# Patient Record
Sex: Female | Born: 1970 | Race: Black or African American | Hispanic: No | Marital: Single | Smoking: Never smoker
Health system: Southern US, Community
[De-identification: ages and names within clinical notes are randomized; demographics above are authoritative.]

## PROBLEM LIST (undated history)

## (undated) DIAGNOSIS — F32A Depression, unspecified: Secondary | ICD-10-CM

## (undated) DIAGNOSIS — Z9851 Tubal ligation status: Secondary | ICD-10-CM

## (undated) DIAGNOSIS — Z9049 Acquired absence of other specified parts of digestive tract: Secondary | ICD-10-CM

## (undated) DIAGNOSIS — F988 Other specified behavioral and emotional disorders with onset usually occurring in childhood and adolescence: Secondary | ICD-10-CM

## (undated) DIAGNOSIS — T7840XA Allergy, unspecified, initial encounter: Secondary | ICD-10-CM

## (undated) DIAGNOSIS — F419 Anxiety disorder, unspecified: Secondary | ICD-10-CM

## (undated) DIAGNOSIS — I10 Essential (primary) hypertension: Secondary | ICD-10-CM

## (undated) DIAGNOSIS — F329 Major depressive disorder, single episode, unspecified: Secondary | ICD-10-CM

## (undated) DIAGNOSIS — E119 Type 2 diabetes mellitus without complications: Secondary | ICD-10-CM

## (undated) DIAGNOSIS — R569 Unspecified convulsions: Secondary | ICD-10-CM

## (undated) HISTORY — PX: INDUCED ABORTION: SHX677

## (undated) HISTORY — DX: Depression, unspecified: F32.A

## (undated) HISTORY — PX: LAPAROSCOPIC CHOLECYSTECTOMY: SUR755

## (undated) HISTORY — DX: Major depressive disorder, single episode, unspecified: F32.9

## (undated) HISTORY — DX: Allergy, unspecified, initial encounter: T78.40XA

## (undated) HISTORY — DX: Anxiety disorder, unspecified: F41.9

## (undated) HISTORY — PX: TUBAL LIGATION: SHX77

## (undated) HISTORY — DX: Type 2 diabetes mellitus without complications: E11.9

## (undated) HISTORY — DX: Unspecified convulsions: R56.9

## (undated) HISTORY — DX: Other specified behavioral and emotional disorders with onset usually occurring in childhood and adolescence: F98.8

---

## 1989-10-10 DIAGNOSIS — Z9049 Acquired absence of other specified parts of digestive tract: Secondary | ICD-10-CM

## 1989-10-10 HISTORY — DX: Acquired absence of other specified parts of digestive tract: Z90.49

## 1998-05-18 ENCOUNTER — Emergency Department (HOSPITAL_COMMUNITY): Admission: EM | Admit: 1998-05-18 | Discharge: 1998-05-18 | Payer: Self-pay | Admitting: Internal Medicine

## 1999-07-16 ENCOUNTER — Emergency Department (HOSPITAL_COMMUNITY): Admission: EM | Admit: 1999-07-16 | Discharge: 1999-07-16 | Payer: Self-pay | Admitting: Emergency Medicine

## 1999-08-27 ENCOUNTER — Other Ambulatory Visit: Admission: RE | Admit: 1999-08-27 | Discharge: 1999-08-27 | Payer: Self-pay | Admitting: Obstetrics & Gynecology

## 1999-12-28 ENCOUNTER — Other Ambulatory Visit: Admission: RE | Admit: 1999-12-28 | Discharge: 1999-12-28 | Payer: Self-pay | Admitting: Obstetrics and Gynecology

## 2000-06-22 ENCOUNTER — Inpatient Hospital Stay (HOSPITAL_COMMUNITY): Admission: AD | Admit: 2000-06-22 | Discharge: 2000-06-22 | Payer: Self-pay | Admitting: Obstetrics and Gynecology

## 2000-08-01 ENCOUNTER — Inpatient Hospital Stay (HOSPITAL_COMMUNITY): Admission: AD | Admit: 2000-08-01 | Discharge: 2000-08-05 | Payer: Self-pay | Admitting: *Deleted

## 2000-08-06 ENCOUNTER — Encounter: Admission: RE | Admit: 2000-08-06 | Discharge: 2000-09-29 | Payer: Self-pay | Admitting: Unknown Physician Specialty

## 2000-08-09 ENCOUNTER — Inpatient Hospital Stay (HOSPITAL_COMMUNITY): Admission: AD | Admit: 2000-08-09 | Discharge: 2000-08-09 | Payer: Self-pay | Admitting: Obstetrics & Gynecology

## 2001-04-03 ENCOUNTER — Encounter: Payer: Self-pay | Admitting: Emergency Medicine

## 2001-04-03 ENCOUNTER — Emergency Department (HOSPITAL_COMMUNITY): Admission: EM | Admit: 2001-04-03 | Discharge: 2001-04-03 | Payer: Self-pay | Admitting: Emergency Medicine

## 2001-07-23 ENCOUNTER — Encounter: Admission: RE | Admit: 2001-07-23 | Discharge: 2001-10-21 | Payer: Self-pay | Admitting: Internal Medicine

## 2001-11-28 ENCOUNTER — Other Ambulatory Visit: Admission: RE | Admit: 2001-11-28 | Discharge: 2001-11-28 | Payer: Self-pay | Admitting: Obstetrics and Gynecology

## 2001-12-14 ENCOUNTER — Emergency Department (HOSPITAL_COMMUNITY): Admission: EM | Admit: 2001-12-14 | Discharge: 2001-12-14 | Payer: Self-pay | Admitting: Emergency Medicine

## 2002-01-04 ENCOUNTER — Encounter: Admission: RE | Admit: 2002-01-04 | Discharge: 2002-04-04 | Payer: Self-pay | Admitting: Otolaryngology

## 2002-01-06 ENCOUNTER — Emergency Department (HOSPITAL_COMMUNITY): Admission: EM | Admit: 2002-01-06 | Discharge: 2002-01-06 | Payer: Self-pay

## 2002-07-15 ENCOUNTER — Encounter: Payer: Self-pay | Admitting: Neurology

## 2002-07-15 ENCOUNTER — Emergency Department (HOSPITAL_COMMUNITY): Admission: EM | Admit: 2002-07-15 | Discharge: 2002-07-15 | Payer: Self-pay | Admitting: Emergency Medicine

## 2002-10-21 ENCOUNTER — Emergency Department (HOSPITAL_COMMUNITY): Admission: EM | Admit: 2002-10-21 | Discharge: 2002-10-21 | Payer: Self-pay | Admitting: Emergency Medicine

## 2002-10-21 ENCOUNTER — Encounter: Payer: Self-pay | Admitting: Emergency Medicine

## 2002-11-07 ENCOUNTER — Other Ambulatory Visit: Admission: RE | Admit: 2002-11-07 | Discharge: 2002-11-07 | Payer: Self-pay | Admitting: Obstetrics and Gynecology

## 2003-01-18 ENCOUNTER — Inpatient Hospital Stay (HOSPITAL_COMMUNITY): Admission: AD | Admit: 2003-01-18 | Discharge: 2003-01-18 | Payer: Self-pay | Admitting: Obstetrics and Gynecology

## 2003-01-21 ENCOUNTER — Encounter: Admission: RE | Admit: 2003-01-21 | Discharge: 2003-04-21 | Payer: Self-pay | Admitting: Internal Medicine

## 2003-04-07 ENCOUNTER — Inpatient Hospital Stay (HOSPITAL_COMMUNITY): Admission: AD | Admit: 2003-04-07 | Discharge: 2003-04-07 | Payer: Self-pay | Admitting: Obstetrics and Gynecology

## 2003-05-15 ENCOUNTER — Inpatient Hospital Stay (HOSPITAL_COMMUNITY): Admission: AD | Admit: 2003-05-15 | Discharge: 2003-05-18 | Payer: Self-pay | Admitting: Obstetrics and Gynecology

## 2003-05-15 ENCOUNTER — Encounter (INDEPENDENT_AMBULATORY_CARE_PROVIDER_SITE_OTHER): Payer: Self-pay

## 2003-05-15 DIAGNOSIS — Z9851 Tubal ligation status: Secondary | ICD-10-CM

## 2003-05-15 HISTORY — DX: Tubal ligation status: Z98.51

## 2003-05-20 ENCOUNTER — Inpatient Hospital Stay (HOSPITAL_COMMUNITY): Admission: AD | Admit: 2003-05-20 | Discharge: 2003-05-20 | Payer: Self-pay | Admitting: Obstetrics and Gynecology

## 2004-01-27 ENCOUNTER — Encounter: Admission: RE | Admit: 2004-01-27 | Discharge: 2004-01-27 | Payer: Self-pay | Admitting: Family Medicine

## 2004-01-28 ENCOUNTER — Encounter: Admission: RE | Admit: 2004-01-28 | Discharge: 2004-01-28 | Payer: Self-pay | Admitting: Family Medicine

## 2004-01-30 ENCOUNTER — Encounter: Admission: RE | Admit: 2004-01-30 | Discharge: 2004-01-30 | Payer: Self-pay | Admitting: Family Medicine

## 2004-02-27 ENCOUNTER — Encounter: Admission: RE | Admit: 2004-02-27 | Discharge: 2004-02-27 | Payer: Self-pay | Admitting: Sports Medicine

## 2004-04-30 ENCOUNTER — Encounter: Admission: RE | Admit: 2004-04-30 | Discharge: 2004-04-30 | Payer: Self-pay | Admitting: Family Medicine

## 2004-08-12 ENCOUNTER — Ambulatory Visit: Payer: Self-pay | Admitting: Family Medicine

## 2004-08-23 ENCOUNTER — Ambulatory Visit: Payer: Self-pay | Admitting: Family Medicine

## 2004-11-05 ENCOUNTER — Ambulatory Visit: Payer: Self-pay | Admitting: Family Medicine

## 2004-11-19 ENCOUNTER — Ambulatory Visit: Payer: Self-pay | Admitting: Sports Medicine

## 2005-02-07 ENCOUNTER — Encounter (INDEPENDENT_AMBULATORY_CARE_PROVIDER_SITE_OTHER): Payer: Self-pay | Admitting: *Deleted

## 2005-02-07 LAB — CONVERTED CEMR LAB

## 2005-02-21 ENCOUNTER — Ambulatory Visit: Payer: Self-pay | Admitting: Family Medicine

## 2005-03-23 ENCOUNTER — Ambulatory Visit: Payer: Self-pay | Admitting: Family Medicine

## 2005-04-15 ENCOUNTER — Ambulatory Visit: Payer: Self-pay | Admitting: Family Medicine

## 2005-05-04 ENCOUNTER — Ambulatory Visit: Payer: Self-pay | Admitting: Sports Medicine

## 2005-05-23 ENCOUNTER — Ambulatory Visit: Payer: Self-pay | Admitting: Family Medicine

## 2005-05-27 ENCOUNTER — Ambulatory Visit: Payer: Self-pay | Admitting: Family Medicine

## 2005-06-23 ENCOUNTER — Ambulatory Visit: Payer: Self-pay | Admitting: Family Medicine

## 2005-08-09 ENCOUNTER — Ambulatory Visit: Payer: Self-pay | Admitting: Family Medicine

## 2005-08-26 ENCOUNTER — Ambulatory Visit: Payer: Self-pay | Admitting: Family Medicine

## 2005-09-05 ENCOUNTER — Ambulatory Visit: Payer: Self-pay | Admitting: Sports Medicine

## 2005-09-16 ENCOUNTER — Ambulatory Visit: Payer: Self-pay | Admitting: Family Medicine

## 2005-10-12 ENCOUNTER — Ambulatory Visit: Payer: Self-pay | Admitting: Family Medicine

## 2005-10-28 ENCOUNTER — Ambulatory Visit: Payer: Self-pay | Admitting: Family Medicine

## 2005-10-31 ENCOUNTER — Ambulatory Visit: Payer: Self-pay | Admitting: Sports Medicine

## 2005-11-04 ENCOUNTER — Ambulatory Visit: Payer: Self-pay | Admitting: Family Medicine

## 2005-11-11 ENCOUNTER — Ambulatory Visit: Payer: Self-pay | Admitting: Family Medicine

## 2005-11-14 ENCOUNTER — Ambulatory Visit: Payer: Self-pay | Admitting: Sports Medicine

## 2005-12-06 ENCOUNTER — Ambulatory Visit: Payer: Self-pay | Admitting: Family Medicine

## 2005-12-14 ENCOUNTER — Ambulatory Visit: Payer: Self-pay | Admitting: Family Medicine

## 2005-12-30 ENCOUNTER — Ambulatory Visit: Payer: Self-pay | Admitting: Family Medicine

## 2006-01-02 ENCOUNTER — Ambulatory Visit: Payer: Self-pay | Admitting: Family Medicine

## 2006-03-29 ENCOUNTER — Ambulatory Visit: Payer: Self-pay | Admitting: Family Medicine

## 2006-05-03 ENCOUNTER — Ambulatory Visit: Payer: Self-pay | Admitting: Sports Medicine

## 2006-05-22 ENCOUNTER — Ambulatory Visit: Payer: Self-pay | Admitting: Family Medicine

## 2006-05-26 ENCOUNTER — Ambulatory Visit: Payer: Self-pay | Admitting: Family Medicine

## 2006-12-07 DIAGNOSIS — K219 Gastro-esophageal reflux disease without esophagitis: Secondary | ICD-10-CM

## 2006-12-07 DIAGNOSIS — J309 Allergic rhinitis, unspecified: Secondary | ICD-10-CM | POA: Insufficient documentation

## 2006-12-07 DIAGNOSIS — F319 Bipolar disorder, unspecified: Secondary | ICD-10-CM | POA: Insufficient documentation

## 2006-12-08 ENCOUNTER — Encounter (INDEPENDENT_AMBULATORY_CARE_PROVIDER_SITE_OTHER): Payer: Self-pay | Admitting: *Deleted

## 2006-12-11 ENCOUNTER — Ambulatory Visit: Payer: Self-pay | Admitting: Family Medicine

## 2007-01-17 ENCOUNTER — Telehealth: Payer: Self-pay | Admitting: *Deleted

## 2007-01-17 ENCOUNTER — Ambulatory Visit: Payer: Self-pay | Admitting: Family Medicine

## 2007-01-17 DIAGNOSIS — J45909 Unspecified asthma, uncomplicated: Secondary | ICD-10-CM | POA: Insufficient documentation

## 2007-02-21 ENCOUNTER — Encounter: Payer: Self-pay | Admitting: *Deleted

## 2009-07-09 ENCOUNTER — Emergency Department (HOSPITAL_BASED_OUTPATIENT_CLINIC_OR_DEPARTMENT_OTHER): Admission: EM | Admit: 2009-07-09 | Discharge: 2009-07-09 | Payer: Self-pay | Admitting: Emergency Medicine

## 2009-12-08 ENCOUNTER — Encounter: Admission: RE | Admit: 2009-12-08 | Discharge: 2009-12-08 | Payer: Self-pay | Admitting: Obstetrics and Gynecology

## 2010-03-30 ENCOUNTER — Emergency Department (HOSPITAL_BASED_OUTPATIENT_CLINIC_OR_DEPARTMENT_OTHER): Admission: EM | Admit: 2010-03-30 | Discharge: 2010-03-30 | Payer: Self-pay | Admitting: Emergency Medicine

## 2010-03-30 ENCOUNTER — Ambulatory Visit: Payer: Self-pay | Admitting: Diagnostic Radiology

## 2010-08-17 ENCOUNTER — Emergency Department (HOSPITAL_COMMUNITY): Admission: EM | Admit: 2010-08-17 | Discharge: 2010-08-17 | Payer: Self-pay | Admitting: Emergency Medicine

## 2010-10-31 ENCOUNTER — Encounter: Payer: Self-pay | Admitting: Obstetrics and Gynecology

## 2010-12-21 LAB — URINALYSIS, ROUTINE W REFLEX MICROSCOPIC
Bilirubin Urine: NEGATIVE
Nitrite: NEGATIVE
Specific Gravity, Urine: 1.009 (ref 1.005–1.030)
Urobilinogen, UA: 0.2 mg/dL (ref 0.0–1.0)

## 2010-12-21 LAB — POCT I-STAT, CHEM 8
Glucose, Bld: 102 mg/dL — ABNORMAL HIGH (ref 70–99)
HCT: 39 % (ref 36.0–46.0)
Hemoglobin: 13.3 g/dL (ref 12.0–15.0)
Potassium: 3.8 mEq/L (ref 3.5–5.1)
Sodium: 141 mEq/L (ref 135–145)

## 2011-01-14 LAB — BASIC METABOLIC PANEL
CO2: 32 mEq/L (ref 19–32)
Calcium: 9.2 mg/dL (ref 8.4–10.5)
Creatinine, Ser: 0.8 mg/dL (ref 0.4–1.2)
GFR calc Af Amer: >60 mL/min (ref >60)

## 2011-02-25 NOTE — Consult Note (Signed)
   NAME:  Holly Hopkins, Holly Hopkins                         ACCOUNT NO.:  [id number]   MEDICAL RECORD NO.:  [id number]                   PATIENT TYPE:  EMS   LOCATION:  MINO                                 FACILITY:  [facility]   PHYSICIAN:  Genene Churn. 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  Otherwise, study was normal.  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  MRA showed one dye-like area in the right middle cerebral artery  interpreted by Dr. Virgel Bouquet as normal.  This was considered a normal study.  Lactic acid level was 1.0.   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   IMPRESSION:  Migraine, 346.10.                                               Genene Churn. Sandria Manly, MD    JML/MEDQ  D:  07/15/2002  T:  07/17/2002  Job:  [id number]

## 2011-02-25 NOTE — Discharge Summary (Signed)
   NAME:  Holly Hopkins, Holly Hopkins                         ACCOUNT NO.:  [id number]   MEDICAL RECORD NO.:  [id number]                   PATIENT TYPE:  INP   LOCATION:  9120                                 FACILITY:  [facility]   PHYSICIAN:  Hal Morales, M.D.             DATE OF BIRTH:  November 17, 1970   DATE OF ADMISSION:  05/15/2003  DATE OF DISCHARGE:  05/18/2003                                 DISCHARGE SUMMARY   ADMISSION DIAGNOSES:  1. Intrauterine pregnancy at term.  2. Previous cesarean section; desires repeat cesarean section.  3. Desires sterilization.  4. Gestational diabetes.   PROCEDURES:  Repeat low transverse cesarean section and bilateral tubal  ligation.   DISCHARGE DIAGNOSES:  1. Intrauterine pregnancy at term.  2. Previous cesarean section; desires repeat cesarean section.  3. Desires sterilization.  4. Gestational diabetes.   HOSPITAL COURSE:  [name] is a 40 year old gravida 3, para 1-0-1-1 who  presents at [redacted] weeks gestation for a repeat cesarean section.  She underwent  this procedure on May 15, 2003 by Dr. Marline Backbone with the birth of a  7 pound 14 ounce female infant named Maxwell with Apgar scores of 8 at 1  minute and 9 at 5 minutes. The patient has done well in the postoperative  period. Her vital signs have remained stable. Her hemoglobin on the first  postoperative day was 9.8. She is breast feeding and doing quite well, and  on her third postoperative day, she is judged to be in satisfactory  condition for discharge. Her incision is clean and intact with sutures. She  has had a JP drain, which has had minimal drainage and will be removed prior  to discharge.   DISCHARGE INSTRUCTIONS:  Per [facility] handout.   DISCHARGE MEDICATIONS:  1. Motrin 600 mg p.o. q.6h. p.r.n. pain.  2. Tylox 1-2 p.o. q.3-4h. pain.  3. Prenatal vitamins.   FOLLOW UP:  Discharge followup will be at [facility] in 6  weeks.     Rica Koyanagi, C.N.M.               Hal Morales, M.D.    SDM/MEDQ  D:  05/18/2003  T:  05/18/2003  Job:  [id number]

## 2011-02-25 NOTE — Op Note (Signed)
[facility] [place]  Patient:    Holly Hopkins, Holly Hopkins                      MRN: [id number] Proc. Date: 08/02/00 Adm. Date:  [id number] Attending:  Pleas Koch                           Operative Report  PREOPERATIVE DIAGNOSES:       1. A 41-week gestation.                               2. Nonreassuring fetal heart rate tracing.                               3. Meconium stained amniotic fluid.  POSTOPERATIVE DIAGNOSES:      1. A 41-week gestation.                               2. Nonreassuring fetal heart rate tracing.                               3. Meconium stained amniotic fluid.                               4. Occiput posterior.                               5. Macrosomia.  OPERATION:                    Primary low transverse cesarean section.  SURGEON:                      Janine Limbo, M.D.  ASSISTANT:                    Miguel Dibble, C.N.M.  ANESTHESIA:                   Epidural.  DISPOSITION:                  The patient is a 40 year old female, gravida 2, para 0-0-1-0, who presents at 41-weeks gestation.  Her amniotic fluid was noted to have thick meconium fluid.  Pitocin was needed to augment her labor. An amnioinfusion was performed.  The patient was noted to have variable and late decelerations.  She was positioned from one side to the other.  She was given oxygen.  The decelerations would improve, but then would recur.  The decision was made to proceed with cesarean delivery.  The patient understands the indications for her procedure and she accepts the risks of, but not limited to, anesthetic complications, bleeding, infections, and possible damage to the surrounding organs.  FINDINGS:                     A 9 pound and 1 ounce female infant [employer]) was delivered from the occiput posterior position.  The Apgars were 8 at one minute and 9 at five minutes.  There was no meconium noted beneath the vocal cords.  The  uterus,  fallopian tubes, and ovaries were normal.  There were bilateral paratubal benign-appearing cysts present.  DESCRIPTION OF PROCEDURE:     The patient was taken to the operating room where it was determined that the epidural she had received for labor would be adequate for a cesarean delivery.  A Foley catheter had previously been placed.  The patients abdomen was prepped with multiple layers of Betadine and sterilely draped.  A low transverse incision was made in the abdomen and carried sharply through the subcutaneous tissue, the fascia, and the anterior peritoneum.  An incision was made in the lower uterine segment and extended transversely.  The fetal head was delivered.  The mouth and nose were suctioned using the ______.  The remainder of the infant was then delivered. There was noted to be a nuchal cord trapped on the shoulder.  The cord was clamped and cut, and the infant was handed to the awaiting pediatric team. Routine cord blood studies were obtained.  The placenta was removed.  The uterine cavity was cleaned of amniotic fluid, clotted blood, and membranes. The uterine incision was closed using a running locking suture of 2-0 Vicryl followed by figure-of-eight sutures of 2-0 Vicryl for hemostasis.  Hemostasis was adequate.  The peritoneal cavity was vigorously irrigated.  Again, hemostasis was adequate.  The anterior peritoneum was closed using a figure-of-eight suture of 0 Vicryl.  The abdominal musculature was irrigated. Hemostasis was adequate.  The fascia was closed using a running suture of 0 Vicryl followed by three interrupted sutures of 0 Vicryl.  The subcutaneous layer was irrigated and hemostasis was adequate.  The subcutaneous layer was closed using a running suture of 2-0 Vicryl.  The skin was reapproximated using skin staples.                                Sponge, needle, and instrument counts were correct on two occasions.  The estimated blood loss for the  procedure as 600 cc.  The patient tolerated her procedure well.  She was taken to the recovery room in stable condition.  The infant was taken to the full-term nursery in stable condition. DD:  08/02/00 TD:  08/02/00 Job: [id number]

## 2011-02-25 NOTE — H&P (Signed)
NAME:  Holly Hopkins, Holly Hopkins                         ACCOUNT NO.:  [id number]   MEDICAL RECORD NO.:  [id number]                   PATIENT TYPE:  INP   LOCATION:  NA                                   FACILITY:  [facility]   PHYSICIAN:  Janine Limbo, M.D.            DATE OF BIRTH:  [birth year]   DATE OF ADMISSION:  05/15/2003  DATE OF DISCHARGE:                                HISTORY & PHYSICAL   HISTORY OF PRESENT ILLNESS:  [name] is a 40 year old female,  gravida 3, para 1-0-1-1, who presents at [redacted] weeks gestation (EDC is May 25, 2003) for a repeat cesarean section.  The patient has been followed at  the [facility] for this pregnancy that has been complicated by gestational  diabetes.  Her blood sugars have been well controlled with diet only.  The  patient was recently diagnosed with herpes, but this was thought to be a  long-term infection.  She also had a positive beta streptococcal culture.   OBSTETRICAL HISTORY:  The patient had a first trimester elective pregnancy  termination in 1991.  She had a cesarean section in 2001 where she delivered  a 9 pound 1 ounce female infant at [redacted] weeks gestation.   DRUG ALLERGIES:  No known drug allergies.   PAST MEDICAL HISTORY:  1. The patient has a history of gestational diabetes as mentioned above.  2. She also has a history of asthma.  3. She has had migraine headaches in the past.  4. She also has a history of Meniere's syndrome.   SOCIAL HISTORY:  The patient denies cigarette use, alcohol use, and  recreational drug use.   REVIEW OF SYSTEMS:  Normal pregnancy complaints.   FAMILY HISTORY:  Noncontributory.   PHYSICAL EXAMINATION:  WEIGHT:  211 pounds.  HEENT:  Within normal limits.  CHEST:  Clear.  HEART:  Regular rate and rhythm.  BREASTS:  Without masses.  ABDOMEN:  Fundal height 37 cm.  EXTREMITIES:  Within normal limits.  NEUROLOGIC:   Grossly normal.  PELVIC:  The cervix is closed and long.   LABORATORY VALUES:  Blood type is O positive.  Antibody screen negative.  Sickle cell negative.  VDRL nonreactive.  Rubella positive.  HBSAG negative.  HIV nonreactive.  Cystic fibrosis is negative.  Pap within normal limits.  Alpha-fetoprotein within normal limits.  The third trimester beta  Streptococcus is positive.  The third trimester GC and chlamydia are  negative.   ASSESSMENT:  1. 38-week gestation.  2. Prior cesarean section.  3. Desires repeat cesarean section.  4. Desires sterilization.   PLAN:  The patient will undergo a repeat low transverse cesarean section and  bilateral tubal ligation.  She understands the indications for her procedure  and he accepts the risks of, but not limited to anesthetic complications,  bleeding, infections, possible damage  to the surrounding organs, and  possible tubal failure (17/1000).                                               Janine Limbo, M.D.    AVS/MEDQ  D:  05/08/2003  T:  05/08/2003  Job:  [id number]

## 2011-02-25 NOTE — H&P (Signed)
[facility] [place]  Patient:    Holly Hopkins, Holly Hopkins                      MRN: [id number] Adm. Date:  [id number] Disc. Date: [id number] Attending:  Cleatrice Burke Dictator:   Vance Gather Duplantis, C.N.M.                         History and Physical  HISTORY OF PRESENT ILLNESS:   Holly Hopkins is a 40 year old single black female, gravida 2, para 0-0-1-0 at 17 6/7 weeks by ultrasound who presents complaining of uterine contractions every 3-5 minutes all day and every 5 minutes yesterday. She denies any leaking or vaginal bleeding. She was seen in the office earlier today and was 2 cm at that time. She denies leaking or vaginal bleeding. She denies nausea, vomiting, headaches or visual disturbances. Her pregnancy has been followed at [facility] by the Certified Nurse Midwife Service and has been essentially uncomplicated. She has a medical history of seizures with migraines but has not required medication or had any problems for the last several years. She has a history of asthma also without current medications. She also had questionable last menstrual period and is group B strep positive.  OB/GYN HISTORY:               She is a gravida 2, para 0-0-1-0 who had an elective AB in 1991 with hyperemesis. She had a colpo in 1996 for abnormal Pap and her Paps have normal since then.  GENERAL MEDICAL HISTORY:      She has no known drug allergies. She reports having had the usual childhood diseases. She reports a history of constipation, a history of asthma and requiring medications in the past but none with this pregnancy and a history of urinary tract and kidney infection in the past also as a child. She had a seizure with a migraine but has not required any medication since 1998. In 1992, she had a cholecystectomy.  FAMILY HISTORY:               Significant for paternal grandmother with congestive heart failure, grandparents and mother with hypertension,  maternal aunt with varicosities, paternal grandmother and aunts with insulin dependent diabetes, sister with epilepsy, paternal grandmother with breast caner. The father of the infant son has the sickle cell trait. The patient had polydactyly and the patients father had a set of twins.  SOCIAL HISTORY:               She is single. The father of the baby is Armanda Magic, he is involved and supportive. She is of the [employer]. She denies any illicit drug use, alcohol or smoking with this pregnancy.  PRENATAL LABS:                Her blood type is O+, her antibody screen is negative, sickle cell trait is negative, syphilis is nonreactive, rubella is immune, hepatitis C surface antigen is negative. HIV is nonreactive. GC and chlamydia are both negative. Pap is within normal limits. A 1 hour glucola is 127 and a maternal serum alpha-fetoprotein was within normal limits. Her 36 week beta strep was positive.  PHYSICAL EXAMINATION:  VITAL SIGNS:                  Stable. She is afebrile.  HEENT:  Grossly within normal limits.  HEART:                        Regular rate and rhythm.  CHEST:                        Clear.  BREASTS:                      Soft and nontender.  ABDOMEN:                      Gravid with uterine contractions every 3-4 minutes. Her fetal heart rate is reactive and reassuring. Her cervix is 3 cm, 90% vertex -2 and bulging membranes.  EXTREMITIES:                  Within normal limits.  ASSESSMENT:                   1. Intrauterine pregnancy at term.                               2. Early labor.                               3. Positive group B strep.                               4. Desires epidural for labor.  PLAN:                         Admit to labor and delivery to follow routine C&M orders and Dr. Trudie Reed has been made aware of the patients admission. The patient may also have and epidural when available and  is to receive penicillin for group B strep prophylaxis. DD:  08/01/00 TD:  08/01/00 Job: [id number]

## 2011-02-25 NOTE — Discharge Summary (Signed)
[facility] [place]  Patient:    Holly Hopkins, Holly Hopkins                      MRN: [id number] Adm. Date:  [id number] Disc. Date: [id number] Attending:  Pleas Koch Dictator:   Miguel Dibble, C.N.M.                           Discharge Summary  DATE OF BIRTH:                03-10-1971  ADMISSION DIAGNOSES:          1. Intrauterine pregnancy at term, early labor.                               2. Positive group beta Strep.  DISCHARGE DIAGNOSES:          1. Delivered viable female infant, assigned Apgar                                  scores of 8 at one minute and 9 at five                                  minutes, weighing 9 pounds 1 ounce, delivered                                  via primary cesarean section.                               2. Macrosomia.                               3. Meconium stained fluid.                               4. Nonreassuring fetal heart tones.  OPERATION/PROCEDURE:          1. Primary low segment transverse cesarean                                  section performed under epidural anesthesia.                               2. Internal fetal monitoring.                               3. Amniotic infusion.                               4. Pitocin augmentation.  HOSPITAL COURSE:              On August 01, 2000 at approximately 1800 hours this 40 year old gravida 2 para 0, Holly Hopkins, was admitted at 40-6/7th weeks intrauterine pregnancy contractions every three to five minutes.  The cervix  was 3 cm dilated, 90% effaced, with vertex at -2.  Bulging bag of waters was noted.  She was given treatment for positive group beta Strep.  By 2230 the cervix had dilated to 4 cm, 90% effaced, vertex at -1 to -2 station, with bulging bag of waters.  An epidural was initiated at midnight.  Amniotomy was performed showing thick Yonan meconium stained amniotic fluid.  Her cervix remained 4 cm dilated, 90% effaced, and amniotic infusion  was initiated.  By 1610 fetal heart rate had developed late decelerations for four contractions, with baseline in the 130s and small accelerations preceding contractions. Decelerations were down to the 120s.  Contractions had spaced out to seven minutes apart.  Oxygen and left lateral position and holding on the Pitocin appeared to have improved fetal heart rate tracing.  Pitocin was resumed and by 2:55 a.m. contractions were every four minutes and mild, each measuring 20-40 mm by LUPC.  Fetal heart rate was in the 130s with average baseline variability and some accelerations with intermittent variable decelerations, two of which had a late component.  By 7:45 a.m. the patient was comfortable with her epidural and was afebrile.  Pitocin was running at 14 mU per minute. She had a period between 6-6:30 a.m. with late decelerations with average short-term variability while in the left lateral position and after turning to the right side fetal heart rate tracing became reactive and no further decelerations ensued.  She was afebrile.  By 9:10 a.m. late decelerations became persistent over the previous 30 minutes.  The cervix was only 3 cm dilated, 75% effaced, with vertex at -3.  After discussion with the patient and the father of the baby it was decided that cesarean section delivery should proceed.  The risks and benefits were reviewed.  A viable baby boy, named Holly Hopkins, assigned Apgar scores of 8 at one minute and 9 at five minutes, weighing 9 pounds 1 ounce, was delivered by uncomplicated cesarean section. No meconium was visualized below the cord on laryngoscopy by the neonatologist.  The mother was transferred to the PACU in stable condition and the baby was transferred to the well baby newborn nursery.  On postoperative day one, August 03, 2000, the patient was afebrile and recovering well. Hemoglobin had dropped to 11.1, WBC 10.6.  Her lungs were clear.  The incision was clean and dry and  intact.  Lochia was small in amount.  She was breast feeding with assistance.  On postoperative day two she was ambulating well. She was passing flatus and tolerating her diet.  The incision was clean and dry and intact.  She was receiving assistance with breast feeding.  On postoperative day three she experienced a significant amount of breast engorgement and received assistance from the lactation specialist.  The incision was clean and dry and intact.  She was tolerating a regular diet. After staples were removed and Steri-Strips applied she was discharged home, felt to have received full benefit of hospitalization.                                Discharge instructions per [facility] booklet.  DISCHARGE MEDICATIONS:        1. Motrin.                               2. Tylox.  3. Prenatal vitamins.                               4. Micronor.  FOLLOW-UP:                    The patient is to follow up in six weeks at [facility].   DD:  08/05/00 TD:  08/06/00 Job: [id number]

## 2011-02-25 NOTE — Consult Note (Signed)
NAME:  Holly Hopkins, Holly Hopkins                         ACCOUNT NO.:  [id number]   MEDICAL RECORD NO.:  [id number]                   PATIENT TYPE:  EMS   LOCATION:  MINO                                 FACILITY:  [facility]   PHYSICIAN:  Genene Churn. Love, MD                   DATE OF BIRTH:  September 07, 1971   DATE OF CONSULTATION:  07/15/2002  DATE OF DISCHARGE:  07/15/2002                                   CONSULTATION   REASON FOR CONSULTATION:  This 40 year old right-handed black single female  was seen  in the emergency room at  the request of Dr. Verlan Friends for  evaluation of suspected neurologic  symptoms.   HISTORY OF PRESENT ILLNESS:  The patient has a one week history of headaches  in the bifrontal region, dull lying down  and occasionally sharp with  standing, that are increased with noise. She has a long history of headaches  in the past, occurring as often as once every other week to once every three  months, usually lasting about two days, and associated with nausea and  vomiting and blurred vision. These headaches have been much less frequent in  the last few months, and the last headache was three months ago. She has a  positive family history of migraine in that her father and a paternal aunt  had had migraine headaches. She has been noted to be lethargic by her mother  this  past week. She has not had any fever or chills, falls with head or  neck trauma. She was seen in the emergency room by Dr. Verlan Friends and referred  for further evaluation.   The patient was evaluated in 1996 at [facility], at which time her  symptoms were back pain, diffuse weakness, cramping sensation in her left  thumb and wrist, and lower extremity weakness. An EEG showed some possible  left temporal slowing. An initial CPK was elevated at 223. An MRI showed  evidence of bilateral wall and T2 signal changes in the  periventricular  region. Because a VDRL was unremarkable, a spinal tap was performed on which  there was no evidence of oligoclonal banding on the CSF, and the CSF IgG was  normal. The question was demyelinating disorder versus migraine, and at  one  point she was placed on Depakote. This was discontinued sometime in 1997.  She had been seen by Dr. Tomasa Rand who had her on Midrin therapy with good  improvement in her headache problems.   There has also been a question of seizures, with episodes in which she would  have brief jerking. This occurred at age 54 or 40, and was at a time when  the patient's sister was  being evaluated by Dr. Tomasa Rand for the question  of seizures. Also the patient's brothers had a history of one blackout  episode. She has been followed by Dr. Velna Hatchet most recently.  PAST MEDICAL HISTORY:  Significant for:  1. Hypertension.  2. Gastroesophageal reflux disease.  3. Headaches.  4. Possible abnormal MRI study.  5. Possible history of seizures.   MEDICATIONS:  Medications in the past have included:  1. A patch for birth control which was discontinued four months ago.  2. KCl 2 q.d.  3. Nexium 1 q.d. for gastroesophageal reflux disease.  4. Chlorthalidone 2 q.d. for hypertension, but apparently she is not taking     these medications on a regular basis.   ALLERGIES:  No known history of allergies.   SOCIAL HISTORY:  She does not smoke cigarettes. She does not drink alcohol.  She went through high school. She works as a Occupational psychologist  for [employer].   FAMILY HISTORY:  Her mother is 44, living well. Her father is 58 and has had  vertical nystagmus with cerebellar dysfunction either secondary to inherited  cerebellar disorder or possibly to carbon cerebellar disease and also has  chronic obstructive pulmonary disease and  lung cancer. She has one brother  who died at age 14,  he was  murdered. She has brothers 37 and 17 alive and  well. She has two sisters 38 and 17 alive and well. She  has one sister age  57 who is a  quadriplegic from a  gunshot wound. She has one child, a son age  65 years.   PHYSICAL EXAMINATION:  GENERAL:  Revealed a well developed female in no  acute distress.  VITAL SIGNS:  Blood pressure in the right and left arm 90/45, heart rate 72  and regular.  NECK:  No carotid  or supraclavicular bruits heard. The neck flexion and  extension maneuvers are unremarkable.  NEUROLOGIC:  Mental status alert and oriented x 3. Follows one, two and  three step commands. Cranial nerve examination with visual fields full,  disks flat, spontaneous venous pulsations seen. Extraocular movements full,  corneals  present. Facial sensation equal,  no facial motor asymmetry.  Hearing present. Air conduction greater than bone conduction. Tongue  midline. Uvula midline. Gag is present. Sternocleidomastoid and trapezius  testing normal motor examination. Good strength in the upper and lower  extremities. Sensory examination is intact to  two point discrimination,  graphesthesia. She felt touch to positioning and vibration on bilateral  lower extremities. She had some difficulty with  pinprick. The deep tendon  reflexes were 2+ and plantar responses were downgoing.  HEENT:  Both tympanic membranes were clear. Mouth was in good repair.  LUNGS:  Clear to auscultation.  HEART:  No murmurs.  ABDOMEN:  Bowel sounds were normal.  BREASTS:  No masses.   IMPRESSION:  1. Headaches, code 784.0.  2. Possible migraine, code 46.10.  3. Possible history of seizures in the past, code 345.10.  4. Hypertension,  code  796.2.  5. Gastroesophageal reflux disease.   PLAN:  The plan at this time is to try her on Vioxx and use Midrin which has  worked for her in the past for pain, and obtain a repeat MRI study and also  a lactic  acid to rule out considerations of stroke with __________  syndrome or CADASIL disease.                                                Genene Churn. Love, MD  JML/MEDQ  D:  07/15/2002  T:   07/17/2002  Job:  [id number]

## 2011-02-25 NOTE — Op Note (Signed)
NAME:  Holly Hopkins, Holly Hopkins                         ACCOUNT NO.:  [id number]   MEDICAL RECORD NO.:  [id number]                   PATIENT TYPE:  INP   LOCATION:  9120                                 FACILITY:  [facility]   PHYSICIAN:  Janine Limbo, M.D.            DATE OF BIRTH:  [birth year]   DATE OF PROCEDURE:  05/15/2003  DATE OF DISCHARGE:                                 OPERATIVE REPORT   PREOPERATIVE DIAGNOSES:  1. Term intrauterine pregnancy.  2. Previous cesarean section.  3. Desires repeat cesarean section.  4. Desires sterilization.  5. Gestational diabetes mellitus.   POSTOPERATIVE DIAGNOSES:  1. Term intrauterine pregnancy.  2. Previous cesarean section.  3. Desires repeat cesarean section.  4. Desires sterilization.  5. Gestational diabetes mellitus.   PROCEDURES:  1. Repeat low transverse cesarean section.  2. Bilateral tubal ligation.   SURGEON:  Janine Limbo, M.D.   FIRST ASSISTANT:  Renaldo Reel. Emilee Hero, C.N.M.   ANESTHESIA:  Spinal.   DISPOSITION:  Holly Hopkins is a 40 year old female, gravida 3, para 1-0-1-1,  who presents at 10 weeks' gestation [facility] May 25, 2003) for repeat  cesarean section and tubal ligation.  She understands the indications for  her procedure and she accepts the risks of, but not limited to, anesthetic  complications, bleeding, infection, possible damage to the surrounding  organs, and possible tubal failure (17 per 1000).   FINDINGS:  A 7 pound 14 ounce female infant Stage manager) was delivered.  The  Apgars were 8 at one minute and 9 at five minutes.  The uterus, fallopian  tubes, and the ovaries were normal.   DESCRIPTION OF PROCEDURE:  The patient was taken to the operating room,  where a spinal anesthetic was given.  The patient's abdomen, perineum, and  vagina were prepped with multiple layers of Betadine.  A Foley catheter was  placed in the bladder.  The patient was sterilely draped.  A low transverse  incision was made in the  abdomen, removing the previous low transverse  incision.  Prior to making the incision the skin was injected with 25 mL of  0.5% Marcaine.  The incision was then extended through the subcutaneous  tissue, the fascia, and the anterior peritoneum.  An incision was made in  the lower uterine segment and extended transversely.  The fetal head was  delivered without difficulty.  The mouth and nose were suctioned.  A nuchal  cord was noted.  The infant was delivered and the cord was clamped.  The  infant was handed to the awaiting pediatric team.  Routine cord blood  studies were obtained.  The placenta was removed.  The uterine cavity was  cleaned of amniotic fluid, clotted blood, and membranes.  The uterine  incision was closed using a running locking suture of 2-0 Vicryl.  Hemostasis was achieved using figure-of-eight sutures of 2-0 Vicryl.  The  left fallopian tube was identified  and followed to its fimbriated end.  A  knuckle of tube was made on the left using a free tie and then a ligature of  0 plain catgut.  The knuckle of tube thus made was excised.  Hemostasis was  adequate.  An identical procedure was carried out on the opposite side.  Again hemostasis was adequate.  The pelvis was vigorously irrigated.  The  anterior peritoneum and the abdominal musculature were reapproximated in the  midline using 2-0 Vicryl.  The fascia and the subcutaneous layer were  irrigated.  The fascia was closed using a running suture of 0 Vicryl  followed by three interrupted sutures of 0 Vicryl.  A Jackson-Pratt drain  was placed in the subcutaneous layer and brought out through the left lower  quadrant.  It was sewn into place using 2-0 silk.  The subcutaneous layer  was closed using running sutures of 0 Vicryl.  The skin was reapproximated  using a subcuticular suture of 4-0 Vicryl.  Sponge, needle, and instrument  counts were correct on two occasions.  The estimated blood loss was 800 mL.  The patient  tolerated her procedure well.  She was taken to the recovery  room in stable condition.  The infant was taken to the full-term nursery in  stable condition.                                               Janine Limbo, M.D.    AVS/MEDQ  D:  05/15/2003  T:  05/15/2003  Job:  [id number]

## 2011-06-23 ENCOUNTER — Emergency Department (HOSPITAL_BASED_OUTPATIENT_CLINIC_OR_DEPARTMENT_OTHER)
Admission: EM | Admit: 2011-06-23 | Discharge: 2011-06-23 | Disposition: A | Discharge status: Home / Self Care | Payer: 59 | Attending: Emergency Medicine | Admitting: Emergency Medicine

## 2011-06-23 ENCOUNTER — Emergency Department (HOSPITAL_BASED_OUTPATIENT_CLINIC_OR_DEPARTMENT_OTHER): Payer: 59

## 2011-06-23 ENCOUNTER — Emergency Department (INDEPENDENT_AMBULATORY_CARE_PROVIDER_SITE_OTHER): Payer: 59

## 2011-06-23 ENCOUNTER — Encounter: Payer: Self-pay | Admitting: *Deleted

## 2011-06-23 DIAGNOSIS — R05 Cough: Secondary | ICD-10-CM | POA: Insufficient documentation

## 2011-06-23 DIAGNOSIS — M542 Cervicalgia: Secondary | ICD-10-CM | POA: Insufficient documentation

## 2011-06-23 DIAGNOSIS — R059 Cough, unspecified: Secondary | ICD-10-CM | POA: Insufficient documentation

## 2011-06-23 DIAGNOSIS — R509 Fever, unspecified: Secondary | ICD-10-CM

## 2011-06-23 DIAGNOSIS — J40 Bronchitis, not specified as acute or chronic: Secondary | ICD-10-CM | POA: Insufficient documentation

## 2011-06-23 MED ORDER — AZITHROMYCIN 250 MG PO TABS: 250.0000 mg | ORAL_TABLET | Freq: Every day | ORAL | Status: AC

## 2011-06-23 MED ORDER — ACETAMINOPHEN 500 MG PO TABS
1000.0000 mg | ORAL_TABLET | Freq: Once | ORAL | Status: AC
  Administered 2011-06-23: 1000 mg via ORAL
  Filled 2011-06-23: qty 2

## 2011-06-23 NOTE — ED Notes (Signed)
3 days of generalized body aches and weakness coughing nausea no emesis also had migraine yesterday and has had neck pain for 2 days pain mainly in her sides (rib area) and back

## 2011-06-23 NOTE — ED Provider Notes (Signed)
History     CSN: [id number] Arrival date & time: 06/23/2011  3:33 PM   Chief Complaint  Patient presents with  . Generalized Body Aches  . Cough  . Neck Pain  . Migraine     (Include location/radiation/quality/duration/timing/severity/associated sxs/prior treatment) Patient is a 40 y.o. female presenting with cough, neck pain, and migraine. The history is provided by the patient.  Cough This is a new problem. The current episode started 2 days ago. The problem occurs constantly. The problem has not changed since onset.The cough is productive of sputum. The maximum temperature recorded prior to her arrival was 101 to 101.9 F. The fever has been present for 3 to 4 days. She has tried nothing for the symptoms. The treatment provided moderate relief. She is a smoker.  Neck Pain   Migraine Associated symptoms include coughing and neck pain.     Past Medical History  Diagnosis Date  . Migraine   . Asthma      History reviewed. No pertinent past surgical history.  History reviewed. No pertinent family history.  History  Substance Use Topics  . Smoking status: Not on file  . Smokeless tobacco: Not on file  . Alcohol Use:     OB History    Grav Para Term Preterm Abortions TAB SAB Ect Mult Living                  Review of Systems  HENT: Positive for neck pain.   Respiratory: Positive for cough.   All other systems reviewed and are negative.    Allergies  Review of patient's allergies indicates no known allergies.  Home Medications   Current Outpatient Rx  Name Route Sig Dispense Refill  . ALBUTEROL SULFATE HFA 108 (90 BASE) MCG/ACT IN AERS Inhalation Inhale 2 puffs into the lungs every 4 (four) hours as needed. For shortness of breath    . BUDESONIDE 180 MCG/ACT IN AEPB Inhalation Inhale 2 puffs into the lungs 2 (two) times daily.     Marland Kitchen ESOMEPRAZOLE MAGNESIUM 40 MG PO CPDR Oral Take 40 mg by mouth daily before breakfast.      . FLUTICASONE PROPIONATE 50  MCG/ACT NA SUSP Nasal Place 1-2 sprays into the nose at bedtime as needed.     Marland Kitchen ALIVE WOMENS ENERGY PO Oral Take 1 tablet by mouth daily.      Marland Kitchen SALINE NASAL SPRAY 0.65 % NA SOLN Nasal Place 1 spray into the nose at bedtime.        Physical Exam    BP 118/68  Pulse 87  Temp(Src) 99.1 F (37.3 C) (Oral)  Resp 20  SpO2 100%  Physical Exam  Nursing note and vitals reviewed. Constitutional: She is oriented to person, place, and time. She appears well-developed and well-nourished.  HENT:  Head: Normocephalic and atraumatic.  Right Ear: External ear normal.  Left Ear: External ear normal.  Mouth/Throat: Oropharynx is clear and moist. No oropharyngeal exudate.  Eyes: Conjunctivae and EOM are normal. Pupils are equal, round, and reactive to light.  Neck: Normal range of motion. Neck supple.  Cardiovascular: Normal rate and regular rhythm.   Abdominal: Soft.  Musculoskeletal: Normal range of motion.  Neurological: She is alert and oriented to person, place, and time. She has normal reflexes.  Skin: Skin is warm.  Psychiatric: She has a normal mood and affect.    ED Course  Procedures  Results for orders placed during the hospital encounter of 08/17/10  URINALYSIS, ROUTINE W REFLEX  MICROSCOPIC      Component Value Range   Color, Urine YELLOW  YELLOW    Appearance CLEAR  CLEAR    Specific Gravity, Urine 1.009  1.005 - 1.030    pH 6.0  5.0 - 8.0    Glucose, UA NEGATIVE  NEGATIVE (mg/dL)   Hgb urine dipstick NEGATIVE  NEGATIVE    Bilirubin Urine NEGATIVE  NEGATIVE    Ketones, ur NEGATIVE  NEGATIVE (mg/dL)   Protein, ur NEGATIVE  NEGATIVE (mg/dL)   Urobilinogen, UA 0.2  0.0 - 1.0 (mg/dL)   Nitrite NEGATIVE  NEGATIVE    Leukocytes, UA    NEGATIVE    Value: NEGATIVE MICROSCOPIC NOT DONE ON URINES WITH NEGATIVE PROTEIN, BLOOD, LEUKOCYTES, NITRITE, OR GLUCOSE <1000 mg/dL.  POCT I-STAT, CHEM 8      Component Value Range   Sodium 141  135 - 145 (mEq/L)   Potassium 3.8  3.5 - 5.1  (mEq/L)   Chloride 106  96 - 112 (mEq/L)   BUN 13  6 - 23 (mg/dL)   Creatinine, Ser 0.9  0.4 - 1.2 (mg/dL)   Glucose, Bld 147 (*) 70 - 99 (mg/dL)   Calcium, Ion 8.29  5.62 - 1.32 (mmol/L)   TCO2 26  0 - 100 (mmol/L)   Hemoglobin 13.3  12.0 - 15.0 (g/dL)   HCT 13.0  86.5 - 78.4 (%)  POCT PREGNANCY, URINE      Component Value Range   Preg Test, Ur       Value: NEGATIVE            THE SENSITIVITY OF THIS     METHODOLOGY IS >24 mIU/mL   Dg Chest 2 View  06/23/2011  *RADIOLOGY REPORT*  Clinical Data: Fever, cough.  CHEST - 2 VIEW  Comparison: None.  Findings: Lungs are clear. No pleural effusion or pneumothorax. The cardiomediastinal contours are within normal limits. The visualized bones and soft tissues are without significant appreciable abnormality. Surgical clips right upper quadrant.  IMPRESSION: No acute cardiopulmonary process.  Original Report Authenticated By: Waneta Martins, M.D.     No diagnosis found.   MDM Chest xray normal,  I will treat for bronchitis.  Pt given zithromax,        Langston Masker, [place] 06/23/11 1922

## 2011-06-27 NOTE — ED Provider Notes (Signed)
Medical screening examination/treatment/procedure(s) were performed by non-physician practitioner and as supervising physician I was immediately available for consultation/collaboration.   Nat Christen, MD 06/27/11 [phone]

## 2012-01-12 ENCOUNTER — Encounter: Payer: Self-pay | Admitting: Obstetrics and Gynecology

## 2012-01-25 ENCOUNTER — Ambulatory Visit (INDEPENDENT_AMBULATORY_CARE_PROVIDER_SITE_OTHER): Payer: 59 | Admitting: Obstetrics and Gynecology

## 2012-01-25 ENCOUNTER — Encounter: Payer: Self-pay | Admitting: Obstetrics and Gynecology

## 2012-01-25 VITALS — BP 114/72 | Temp 98.5°F | Ht 63.5 in | Wt 236.0 lb

## 2012-01-25 DIAGNOSIS — B009 Herpesviral infection, unspecified: Secondary | ICD-10-CM

## 2012-01-25 DIAGNOSIS — N898 Other specified noninflammatory disorders of vagina: Secondary | ICD-10-CM

## 2012-01-25 DIAGNOSIS — IMO0002 Reserved for concepts with insufficient information to code with codable children: Secondary | ICD-10-CM

## 2012-01-25 DIAGNOSIS — N63 Unspecified lump in unspecified breast: Secondary | ICD-10-CM

## 2012-01-25 DIAGNOSIS — E88819 Insulin resistance, unspecified: Secondary | ICD-10-CM | POA: Insufficient documentation

## 2012-01-25 DIAGNOSIS — D259 Leiomyoma of uterus, unspecified: Secondary | ICD-10-CM

## 2012-01-25 DIAGNOSIS — N6320 Unspecified lump in the left breast, unspecified quadrant: Secondary | ICD-10-CM

## 2012-01-25 DIAGNOSIS — Z124 Encounter for screening for malignant neoplasm of cervix: Secondary | ICD-10-CM

## 2012-01-25 DIAGNOSIS — D219 Benign neoplasm of connective and other soft tissue, unspecified: Secondary | ICD-10-CM

## 2012-01-25 DIAGNOSIS — O139 Gestational [pregnancy-induced] hypertension without significant proteinuria, unspecified trimester: Secondary | ICD-10-CM

## 2012-01-25 DIAGNOSIS — A64 Unspecified sexually transmitted disease: Secondary | ICD-10-CM

## 2012-01-25 DIAGNOSIS — E8881 Metabolic syndrome: Secondary | ICD-10-CM

## 2012-01-25 DIAGNOSIS — G43909 Migraine, unspecified, not intractable, without status migrainosus: Secondary | ICD-10-CM | POA: Insufficient documentation

## 2012-01-25 DIAGNOSIS — J45909 Unspecified asthma, uncomplicated: Secondary | ICD-10-CM | POA: Insufficient documentation

## 2012-01-25 DIAGNOSIS — Z01419 Encounter for gynecological examination (general) (routine) without abnormal findings: Secondary | ICD-10-CM

## 2012-01-25 DIAGNOSIS — R569 Unspecified convulsions: Secondary | ICD-10-CM

## 2012-01-25 MED ORDER — FLUCONAZOLE 150 MG PO TABS: 150.0000 mg | ORAL_TABLET | Freq: Once | ORAL | Status: AC

## 2012-01-25 NOTE — Progress Notes (Signed)
Subjective:    Holly Hopkins is a 41 y.o. female, [id number], who presents for an annual exam. The patient reports vaginal discharge with odor, believes it may be BV but also had some itching.  Used Monistat with some relief but other symptoms  persisted.    History   Social History  . Marital Status: Single    Spouse Name: N/A    Number of Children: N/A  . Years of Education: N/A   Social History Main Topics  . Smoking status: Never Smoker   . Smokeless tobacco: None  . Alcohol Use: Yes     social  . Drug Use: No  . Sexually Active: Not Currently -- Female partner(s)    Birth Control/ Protection: Other-see comments     BTL   Other Topics Concern  . None   Social History Narrative  . None    Last mammogram: was normal-  2012 Last pap: was normal-  2012   Menstrual cycle:   LMP: Patient's last menstrual period was 01/14/2012.  Review of Systems Pertinent items are noted in HPI. Denies pelvic pain, uti symptoms, change in BM,  irregular bleeding, menopausal symptoms or rectal bleeding   Objective:    BP 114/72  Temp(Src) 98.5 F (36.9 C) (Oral)  Ht 5' 3.5" (1.613 m)  Wt 236 lb (107.049 kg)  BMI 41.15 kg/m2  LMP 01/14/2012 Weight:  Wt Readings from Last 1 Encounters:  01/25/12 236 lb (107.049 kg)   BMI: Body mass index is 41.15 kg/(m^2). General Appearance: Alert, appropriate appearance for age. No acute distress HEENT: Grossly normal Neck / Thyroid: Supple, no masses, nodes or enlargement Lungs: clear to auscultation bilaterally Back: No CVA tenderness Breast Exam: No masses or nodes.No dimpling, nipple retraction or discharge. Cardiovascular: Regular rate and rhythm. S1, S2, no murmur Gastrointestinal: Soft, non-tender, no masses or organomegaly Pelvic Exam: External genitalia: normal general appearance Vaginal: normal rugae Cervix: normal appearance Adnexa: normal bimanual exam Uterus: normal single, nontender Rectal: good sphincter tone and no  masses Rectovaginal: not indicated and normal rectal, no masses Lymphatic Exam: Non-palpable nodes in neck, clavicular, axillary, or inguinal regions Skin: no rash or abnormalities Extremities: no redness or tenderness in the calves or thighs, no edema, negative Homan's  Neurologic: grossly normal Psychiatric: Alert and oriented, appropriate affect.   Wet Prep: pH 4.5, negative whiff, neg clue, trich, few yeast   Assessment:    Normal gyn exam  Pruritic Vaginitis   Plan:    mammogram pap smear return annually or prn Diflucan 150 mg #1 1 po stat 1 rf Boric Acid Capsules 600 mg #30 1 pv twice weekly rf 1 year       Clairissa Valvano,ELMIRAPA-C

## 2012-01-25 NOTE — Progress Notes (Signed)
Regular Periods:yes  Monthly Breast exam:yes Tetanus<52yrs:yes Nl.Bladder Function:yes Daily BMs:yes Healthy Diet:yes Calcium:yes Mammogram:yes Exercise:yes Seatbelt: no Abuse at home: yes Stressful work:yes Sigmoid-colonoscopy: AGE 41

## 2012-01-26 LAB — PAP IG W/ RFLX HPV ASCU

## 2012-05-07 ENCOUNTER — Ambulatory Visit (INDEPENDENT_AMBULATORY_CARE_PROVIDER_SITE_OTHER): Payer: 59 | Admitting: Family Medicine

## 2012-05-07 VITALS — BP 110/64 | HR 75 | Temp 98.8°F | Resp 16 | Ht 64.0 in | Wt 231.0 lb

## 2012-05-07 DIAGNOSIS — R638 Other symptoms and signs concerning food and fluid intake: Secondary | ICD-10-CM

## 2012-05-07 DIAGNOSIS — R110 Nausea: Secondary | ICD-10-CM

## 2012-05-07 DIAGNOSIS — R197 Diarrhea, unspecified: Secondary | ICD-10-CM

## 2012-05-07 DIAGNOSIS — R109 Unspecified abdominal pain: Secondary | ICD-10-CM

## 2012-05-07 DIAGNOSIS — IMO0001 Reserved for inherently not codable concepts without codable children: Secondary | ICD-10-CM

## 2012-05-07 LAB — POCT CBC
HCT, POC: 42.4 % (ref 37.7–47.9)
Hemoglobin: 12.8 g/dL (ref 12.2–16.2)
MCH, POC: 24.8 pg — AB (ref 27–31.2)
MPV: 8.9 fL (ref 0–99.8)
POC Granulocyte: 3.2 (ref 2–6.9)
POC MID %: 8.3 %M (ref 0–12)
RBC: 5.16 M/uL (ref 4.04–5.48)
WBC: 6 10*3/uL (ref 4.6–10.2)

## 2012-05-07 LAB — GLUCOSE, POCT (MANUAL RESULT ENTRY): POC Glucose: 80 mg/dl (ref 70–99)

## 2012-05-07 MED ORDER — DICYCLOMINE HCL 10 MG PO CAPS: 10.0000 mg | ORAL_CAPSULE | Freq: Three times a day (TID) | ORAL | Status: DC

## 2012-05-07 MED ORDER — ONDANSETRON 4 MG PO TBDP: ORAL_TABLET | ORAL | Status: DC

## 2012-05-07 NOTE — Patient Instructions (Signed)
Bentyl 10 mg one or two every six hours as needed for cramping and diarrhea Zofran for nausea  Return if worse

## 2012-05-07 NOTE — Progress Notes (Signed)
Subjective: Patient is here with a six-day history of diarrhea. She had some days of nausea and vomiting. She really is not down well. She didn't work on Thursday and Friday. She tried to work today but couldn't. She had a number of diarrheal movements yesterday, and about 5 already today. She is very thirsty. This is not usual for her. No one else at home is sick. She does not know the cause of it.  Objective: Overweight Afro-American female who obviously doesn't feel well. Her throat was clear. Mucous membranes look fairly moist. Neck supple without nodes. TMs normal. Chest clear. Heart regular without murmurs gallops or arrhythmias. Abdomen had active bowel sounds. Is soft. Has some generalized nonspecific tenderness.  Results for orders placed in visit on 05/07/12  POCT CBC      Component Value Range   WBC 6.0  4.6 - 10.2 K/uL   Lymph, poc 2.3  0.6 - 3.4   POC LYMPH PERCENT 38.0  10 - 50 %L   MID (cbc) 0.5  0 - 0.9   POC MID % 8.3  0 - 12 %M   POC Granulocyte 3.2  2 - 6.9   Granulocyte percent 53.7  37 - 80 %G   RBC 5.16  4.04 - 5.48 M/uL   Hemoglobin 12.8  12.2 - 16.2 g/dL   HCT, POC 16.1  09.6 - 47.9 %   MCV 82.1  80 - 97 fL   MCH, POC 24.8 (*) 27 - 31.2 pg   MCHC 30.2 (*) 31.8 - 35.4 g/dL   RDW, POC 04.5     Platelet Count, POC 311  142 - 424 K/uL   MPV 8.9  0 - 99.8 fL  GLUCOSE, POCT (MANUAL RESULT ENTRY)      Component Value Range   POC Glucose 80  70 - 99 mg/dl   Assessment: Gastroenteritis Nausea Diarrhea Abdominal cramping  Plan: Bentyl Zofran  Return if at all worse Push fluids Work excuse through tomorrow

## 2012-06-13 ENCOUNTER — Ambulatory Visit (INDEPENDENT_AMBULATORY_CARE_PROVIDER_SITE_OTHER): Payer: 59 | Admitting: Physician Assistant

## 2012-06-13 VITALS — BP 92/62 | HR 93 | Temp 98.7°F | Resp 16 | Ht 63.5 in | Wt 229.0 lb

## 2012-06-13 DIAGNOSIS — R569 Unspecified convulsions: Secondary | ICD-10-CM | POA: Insufficient documentation

## 2012-06-13 DIAGNOSIS — J069 Acute upper respiratory infection, unspecified: Secondary | ICD-10-CM

## 2012-06-13 MED ORDER — IPRATROPIUM BROMIDE 0.03 % NA SOLN: 2.0000 | Freq: Two times a day (BID) | NASAL | Status: DC

## 2012-06-13 MED ORDER — BENZONATATE 100 MG PO CAPS: 100.0000 mg | ORAL_CAPSULE | Freq: Three times a day (TID) | ORAL | Status: AC | PRN

## 2012-06-13 MED ORDER — GUAIFENESIN ER 1200 MG PO TB12: 1.0000 | ORAL_TABLET | Freq: Two times a day (BID) | ORAL | Status: DC | PRN

## 2012-06-13 NOTE — Progress Notes (Signed)
Subjective:    Patient ID: Holly Hopkins, female    DOB: 08/21/1971, 41 y.o.   MRN: [id number]  HPI  This 41 y.o. female presents for evaluation of illness x 2 days.  Stuffy nose, sneezing, watery eyes, coughing.  Feels tired.  Felt warm, but no fever.  No GI/GU symptoms.  Has increased oral fluids. Cough is non-productive.   Review of Systems    Past Medical History  Diagnosis Date  . Migraine     migraine is aura presenting before seizure.  . Asthma   . Seizure     stress induced; has migraine aura    Past Surgical History  Procedure Date  . Tubal ligation   . Laparoscopic cholecystectomy   . Induced abortion   . Cesarean section     x2    Prior to Admission medications   Medication Sig Start Date End Date Taking? Authorizing Provider  albuterol (PROVENTIL HFA;VENTOLIN HFA) 108 (90 BASE) MCG/ACT inhaler Inhale 2 puffs into the lungs every 4 (four) hours as needed. For shortness of breath   Yes Historical Provider, MD  budesonide (PULMICORT) 180 MCG/ACT inhaler Inhale 2 puffs into the lungs 2 (two) times daily.    Yes Historical Provider, MD  esomeprazole (NEXIUM) 40 MG capsule Take 40 mg by mouth daily before breakfast.     Yes Historical Provider, MD  fluticasone (FLONASE) 50 MCG/ACT nasal spray Place 1-2 sprays into the nose at bedtime as needed.    Yes Historical Provider, MD  Multiple Vitamins-Minerals (ALIVE WOMENS ENERGY PO) Take 1 tablet by mouth daily.     Yes Historical Provider, MD    No Known Allergies  History   Social History  . Marital Status: Single    Spouse Name: n/a    Number of Children: 2  . Years of Education: 14   Occupational History  . Production designer, theatre/television/film Rep [employer]   Social History Main Topics  . Smoking status: Never Smoker   . Smokeless tobacco: Never Used  . Alcohol Use: 0.0 - 0.5 oz/week    0-1 drink(s) per week     socially/holidays  . Drug Use: No  . Sexually Active: Not Currently -- Female partner(s)   Birth Control/ Protection: Surgical     BTL   Other Topics Concern  . Not on file   Social History Narrative  . No narrative on file    Family History  Problem Relation Age of Onset  . Diabetes Mother   . Heart disease Mother   . Diabetes Father   . Cancer Father     THYROID  . Hypertension Father   . Hyperlipidemia Father   . Mental illness Brother     PSTD,DEPRESSION  . Migraines Son   . Allergy (severe) Son   . Diabetes Maternal Aunt   . Goiter Maternal Uncle   . Diabetes Paternal Aunt   . Alcohol abuse Paternal Uncle     ALCOHOL AND DRUGS  . Heart disease Maternal Grandmother   . Diabetes Maternal Grandmother   . Hypertension Maternal Grandmother   . Diabetes Maternal Grandfather   . Heart disease Maternal Grandfather   . Cancer Maternal Grandfather     QUESTION WHAT KIND  . Heart disease Paternal Grandmother   . Diabetes Paternal Grandmother   . Hypertension Paternal Grandmother   . Cancer Paternal Grandmother     BREAST  . Migraines Son   . Allergy (severe) Son   . Heart disease  Maternal Aunt   . Diabetes Paternal Aunt   . Arthritis Paternal Aunt        Objective:   Physical Exam  Blood pressure 92/62, pulse 93, temperature 98.7 F (37.1 C), resp. rate 16, height 5' 3.5" (1.613 m), weight 229 lb (103.874 kg), last menstrual period 06/04/2012, SpO2 98.00%. Body mass index is 39.93 kg/(m^2). Well-developed, well nourished BF who is awake, alert and oriented, in NAD. HEENT: [place]/AT, sclera and conjunctiva are clear.  EAC are patent, TMs are normal in appearance. Nasal mucosa is pink and moist, with congestion on the left. OP is clear. Neck: supple, non-tender, no lymphadenopathy, thyromegaly. Heart: RRR, no murmur Lungs: normal effort, CTA without wheezes. Skin: warm and dry without rash.     Assessment & Plan:   1. Acute upper respiratory infections of unspecified site  ipratropium (ATROVENT) 0.03 % nasal spray, Guaifenesin (MUCINEX MAXIMUM STRENGTH)  1200 MG TB12, benzonatate (TESSALON) 100 MG capsule   Continue routine meds.  Anticipatory guidance provided.  Supportive care. Work note for this afternoon and tomorrow.

## 2012-06-13 NOTE — Patient Instructions (Signed)
Get lots of rest and drink at least 64 ounces of water daily. You can expect your symptoms to worsen over then next several days, before improving on day 6-7 of your illness.

## 2013-01-03 ENCOUNTER — Ambulatory Visit (INDEPENDENT_AMBULATORY_CARE_PROVIDER_SITE_OTHER): Payer: BC Managed Care – PPO | Admitting: Emergency Medicine

## 2013-01-03 VITALS — BP 119/82 | HR 78 | Temp 99.4°F | Resp 16 | Ht 64.0 in | Wt 232.0 lb

## 2013-01-03 DIAGNOSIS — J209 Acute bronchitis, unspecified: Secondary | ICD-10-CM

## 2013-01-03 DIAGNOSIS — J018 Other acute sinusitis: Secondary | ICD-10-CM

## 2013-01-03 MED ORDER — PSEUDOEPHEDRINE-GUAIFENESIN ER 60-600 MG PO TB12: 1.0000 | ORAL_TABLET | Freq: Two times a day (BID) | ORAL | Status: DC

## 2013-01-03 MED ORDER — AMOXICILLIN-POT CLAVULANATE 875-125 MG PO TABS: 1.0000 | ORAL_TABLET | Freq: Two times a day (BID) | ORAL | Status: DC

## 2013-01-03 MED ORDER — HYDROCOD POLST-CHLORPHEN POLST 10-8 MG/5ML PO LQCR: 5.0000 mL | Freq: Two times a day (BID) | ORAL | Status: DC | PRN

## 2013-01-03 MED ORDER — ALBUTEROL SULFATE HFA 108 (90 BASE) MCG/ACT IN AERS: 2.0000 | INHALATION_SPRAY | RESPIRATORY_TRACT | Status: DC | PRN

## 2013-01-03 NOTE — Progress Notes (Signed)
[facility] [address], [place] [address] [phone]  Date:  01/03/2013   Name:  Holly Hopkins   DOB:  [birth year]   MRN:  [id number]  PCP:  Janine Limbo, MD    Chief Complaint: Cough, Shortness of Breath and Fever   History of Present Illness:  Holly Hopkins is a 42 y.o. very pleasant female patient who presents with the following:  Ill since Sunday with sore throat and cough.  Now non productive was purulent over the weekend.  Purulent nasal drainage and congestion.  Some wheezing and shortness of breath with exertion.  No nausea or vomiting.  No stool change.  Hoarse.  Has a fever and occasional chills.  No improvement with over the counter medications or other home remedies.  Denies other complaint or health concern today.   Patient Active Problem List  Diagnosis  . BIPOLAR DISORDER  . Allergic Rhinitis, Cause Unspecified  . ASTHMA, EXERCISE INDUCED  . ASTHMA  . GASTROESOPHAGEAL REFLUX, NO ESOPHAGITIS  . Migraine  . Routine cultures positive for HSV2  . PIH (pregnancy induced hypertension)  . Fibroids  . Breast mass, left  . STD (sexually transmitted disease)  . Insulin resistance  . Seizure    Past Medical History  Diagnosis Date  . Migraine     migraine is aura presenting before seizure.  . Asthma   . Seizure     stress induced; has migraine aura  . Allergy   . Depression     Past Surgical History  Procedure Laterality Date  . Tubal ligation    . Laparoscopic cholecystectomy    . Induced abortion    . Cesarean section      x2    History  Substance Use Topics  . Smoking status: Never Smoker   . Smokeless tobacco: Never Used  . Alcohol Use: 0 - .5 oz/week    0-1 drink(s) per week     Comment: socially/holidays    Family History  Problem Relation Age of Onset  . Diabetes Mother   . Heart disease Mother   . Diabetes Father   . Cancer Father     THYROID  . Hypertension Father   . Hyperlipidemia Father   .  Mental illness Brother     PSTD,DEPRESSION  . Migraines Son   . Allergy (severe) Son   . Diabetes Maternal Aunt   . Goiter Maternal Uncle   . Diabetes Paternal Aunt   . Alcohol abuse Paternal Uncle     ALCOHOL AND DRUGS  . Heart disease Maternal Grandmother   . Diabetes Maternal Grandmother   . Hypertension Maternal Grandmother   . Diabetes Maternal Grandfather   . Heart disease Maternal Grandfather   . Cancer Maternal Grandfather     QUESTION WHAT KIND  . Heart disease Paternal Grandmother   . Diabetes Paternal Grandmother   . Hypertension Paternal Grandmother   . Cancer Paternal Grandmother     BREAST  . Migraines Son   . Allergy (severe) Son   . Heart disease Maternal Aunt   . Diabetes Paternal Aunt   . Arthritis Paternal Aunt     No Known Allergies  Medication list has been reviewed and updated.  Current Outpatient Prescriptions on File Prior to Visit  Medication Sig Dispense Refill  . albuterol (PROVENTIL HFA;VENTOLIN HFA) 108 (90 BASE) MCG/ACT inhaler Inhale 2 puffs into the lungs every 4 (four) hours as needed. For shortness of breath      .  Multiple Vitamins-Minerals (ALIVE WOMENS ENERGY PO) Take 1 tablet by mouth daily.        . budesonide (PULMICORT) 180 MCG/ACT inhaler Inhale 2 puffs into the lungs 2 (two) times daily.       Marland Kitchen esomeprazole (NEXIUM) 40 MG capsule Take 40 mg by mouth daily before breakfast.        . fluticasone (FLONASE) 50 MCG/ACT nasal spray Place 1-2 sprays into the nose at bedtime as needed.       . Guaifenesin (MUCINEX MAXIMUM STRENGTH) 1200 MG TB12 Take 1 tablet (1,200 mg total) by mouth every 12 (twelve) hours as needed.  14 tablet  1  . ipratropium (ATROVENT) 0.03 % nasal spray Place 2 sprays into the nose 2 (two) times daily.  30 mL  0   No current facility-administered medications on file prior to visit.    Review of Systems:  As per HPI, otherwise negative.  Marland Kitchen  Physical Examination: Filed Vitals:   01/03/13 2015  BP: 119/82   Pulse: 78  Temp: 99.4 F (37.4 C)  Resp: 16   Filed Vitals:   01/03/13 2015  Height: 5\' 4"  (1.626 m)  Weight: 232 lb (105.235 kg)   Body mass index is 39.8 kg/(m^2). Ideal Body Weight: Weight in (lb) to have BMI = 25: 145.3  GEN: WDWN, NAD, Non-toxic, A & O x 3 HEENT: Atraumatic, Normocephalic. Neck supple. No masses, No LAD. Ears and Nose: No external deformity. CV: RRR, No M/G/R. No JVD. No thrill. No extra heart sounds. PULM: CTA B, scattered wheezes, no crackles, rhonchi. No retractions. No resp. distress. No accessory muscle use. ABD: S, NT, ND, +BS. No rebound. No HSM. EXTR: No c/c/e NEURO Normal gait.  PSYCH: Normally interactive. Conversant. Not depressed or anxious appearing.  Calm demeanor.    Assessment and Plan: Sinusitis Bronchitis with bronchospasm augmentin mucinex d tussionex Albuterol mdi   Signed,  Phillips Odor, MD

## 2013-01-03 NOTE — Patient Instructions (Signed)
astSinusitis Sinusitis is redness, soreness, and swelling (inflammation) of the paranasal sinuses. Paranasal sinuses are air pockets within the bones of your face (beneath the eyes, the middle of the forehead, or above the eyes). In healthy paranasal sinuses, mucus is able to drain out, and air is able to circulate through them by way of your nose. However, when your paranasal sinuses are inflamed, mucus and air can become trapped. This can allow bacteria and other germs to grow and cause infection. Sinusitis can develop quickly and last only a short time (acute) or continue over a long period (chronic). Sinusitis that lasts for more than 12 weeks is considered chronic.  CAUSES  Causes of sinusitis include:  Allergies.  Structural abnormalities, such as displacement of the cartilage that separates your nostrils (deviated septum), which can decrease the air flow through your nose and sinuses and affect sinus drainage.  Functional abnormalities, such as when the small hairs (cilia) that line your sinuses and help remove mucus do not work properly or are not present. SYMPTOMS  Symptoms of acute and chronic sinusitis are the same. The primary symptoms are pain and pressure around the affected sinuses. Other symptoms include:  Upper toothache.  Earache.  Headache.  Bad breath.  Decreased sense of smell and taste.  A cough, which worsens when you are lying flat.  Fatigue.  Fever.  Thick drainage from your nose, which often is green and may contain pus (purulent).  Swelling and warmth over the affected sinuses. DIAGNOSIS  Your caregiver will perform a physical exam. During the exam, your caregiver may:  Look in your nose for signs of abnormal growths in your nostrils (nasal polyps).  Tap over the affected sinus to check for signs of infection.  View the inside of your sinuses (endoscopy) with a special imaging device with a light attached (endoscope), which is inserted into your  sinuses. If your caregiver suspects that you have chronic sinusitis, one or more of the following tests may be recommended:  Allergy tests.  Nasal culture A sample of mucus is taken from your nose and sent to a lab and screened for bacteria.  Nasal cytology A sample of mucus is taken from your nose and examined by your caregiver to determine if your sinusitis is related to an allergy. TREATMENT  Most cases of acute sinusitis are related to a viral infection and will resolve on their own within 10 days. Sometimes medicines are prescribed to help relieve symptoms (pain medicine, decongestants, nasal steroid sprays, or saline sprays).  However, for sinusitis related to a bacterial infection, your caregiver will prescribe antibiotic medicines. These are medicines that will help kill the bacteria causing the infection.  Rarely, sinusitis is caused by a fungal infection. In theses cases, your caregiver will prescribe antifungal medicine. For some cases of chronic sinusitis, surgery is needed. Generally, these are cases in which sinusitis recurs more than 3 times per year, despite other treatments. HOME CARE INSTRUCTIONS   Drink plenty of water. Water helps thin the mucus so your sinuses can drain more easily.  Use a humidifier.  Inhale steam 3 to 4 times a day (for example, sit in the bathroom with the shower running).  Apply a warm, moist washcloth to your face 3 to 4 times a day, or as directed by your caregiver.  Use saline nasal sprays to help moisten and clean your sinuses.  Take over-the-counter or prescription medicines for pain, discomfort, or fever only as directed by your caregiver. SEEK IMMEDIATE MEDICAL  CARE IF:  You have increasing pain or severe headaches.  You have nausea, vomiting, or drowsiness.  You have swelling around your face.  You have vision problems.  You have a stiff neck.  You have difficulty breathing. MAKE SURE YOU:   Understand these  instructions.  Will watch your condition.  Will get help right away if you are not doing well or get worse. Document Released: 09/26/2005 Document Revised: 12/19/2011 Document Reviewed: 10/11/2011 [facility] Patient Information 2013 [place], [place]. Asthma, Adult Asthma is caused by narrowing of the air passages in the lungs. It may be triggered by pollen, dust, animal dander, molds, some foods, respiratory infections, exposure to smoke, exercise, emotional stress or other allergens (things that cause allergic reactions or allergies). Repeat attacks are common. HOME CARE INSTRUCTIONS   Use prescription medications as ordered by your caregiver.  Avoid pollen, dust, animal dander, molds, smoke and other things that cause attacks at home and at work.  You may have fewer attacks if you decrease dust in your home. Electrostatic air cleaners may help.  It may help to replace your pillows or mattress with materials less likely to cause allergies.  Talk to your caregiver about an action plan for managing asthma attacks at home, including, the use of a peak flow meter which measures the severity of your asthma attack. An action plan can help minimize or stop the attack without having to seek medical care.  If you are not on a fluid restriction, drink 8 to 10 glasses of water each day.  Always have a plan prepared for seeking medical attention, including, calling your physician, accessing local emergency care, and calling 911 (in the [place] for a severe attack.  Discuss possible exercise routines with your caregiver.  If animal dander is the cause of asthma, you may need to get rid of pets. SEEK MEDICAL CARE IF:   You have wheezing and shortness of breath even if taking medicine to prevent attacks.  You have muscle aches, chest pain or thickening of sputum.  Your sputum changes from clear or white to yellow, green, gray, or bloody.  You have any problems that may be related to the medicine you  are taking (such as a rash, itching, swelling or trouble breathing). SEEK IMMEDIATE MEDICAL CARE IF:   Your usual medicines do not stop your wheezing or there is increased coughing and/or shortness of breath.  You have increased difficulty breathing.  You have a fever. MAKE SURE YOU:   Understand these instructions.  Will watch your condition.  Will get help right away if you are not doing well or get worse. Document Released: 09/26/2005 Document Revised: 12/19/2011 Document Reviewed: 05/14/2008 [facility] Patient Information 2013 [place], [place]. Bronchitis Bronchitis is the body's way of reacting to injury and/or infection (inflammation) of the bronchi. Bronchi are the air tubes that extend from the windpipe into the lungs. If the inflammation becomes severe, it may cause shortness of breath. CAUSES  Inflammation may be caused by:  A virus.  Germs (bacteria).  Dust.  Allergens.  Pollutants and many other irritants. The cells lining the bronchial tree are covered with tiny hairs (cilia). These constantly beat upward, away from the lungs, toward the mouth. This keeps the lungs free of pollutants. When these cells become too irritated and are unable to do their job, mucus begins to develop. This causes the characteristic cough of bronchitis. The cough clears the lungs when the cilia are unable to do their job. Without either of these  protective mechanisms, the mucus would settle in the lungs. Then you would develop pneumonia. Smoking is a common cause of bronchitis and can contribute to pneumonia. Stopping this habit is the single most important thing you can do to help yourself. TREATMENT   Your caregiver may prescribe an antibiotic if the cough is caused by bacteria. Also, medicines that open up your airways make it easier to breathe. Your caregiver may also recommend or prescribe an expectorant. It will loosen the mucus to be coughed up. Only take over-the-counter or prescription  medicines for pain, discomfort, or fever as directed by your caregiver.  Removing whatever causes the problem (smoking, for example) is critical to preventing the problem from getting worse.  Cough suppressants may be prescribed for relief of cough symptoms.  Inhaled medicines may be prescribed to help with symptoms now and to help prevent problems from returning.  For those with recurrent (chronic) bronchitis, there may be a need for steroid medicines. SEEK IMMEDIATE MEDICAL CARE IF:   During treatment, you develop more pus-like mucus (purulent sputum).  You have a fever.  Your baby is older than 3 months with a rectal temperature of 102 F (38.9 C) or higher.  Your baby is 66 months old or younger with a rectal temperature of 100.4 F (38 C) or higher.  You become progressively more ill.  You have increased difficulty breathing, wheezing, or shortness of breath. It is necessary to seek immediate medical care if you are elderly or sick from any other disease. MAKE SURE YOU:   Understand these instructions.  Will watch your condition.  Will get help right away if you are not doing well or get worse. Document Released: 09/26/2005 Document Revised: 12/19/2011 Document Reviewed: 08/05/2008 [facility] Patient Information 2013 [place], [place].

## 2013-01-04 NOTE — Progress Notes (Signed)
Reviewed and agree.

## 2013-01-08 ENCOUNTER — Telehealth: Payer: Self-pay

## 2013-01-08 NOTE — Telephone Encounter (Signed)
Pt requesting extension of out of work note and will be going back to work tomorrow April 2   Best phone [phone]

## 2013-01-08 NOTE — Telephone Encounter (Signed)
Work note done. Advised patient, if she can not return tomorrow she should return to office for a recheck.

## 2013-10-17 ENCOUNTER — Encounter (HOSPITAL_COMMUNITY): Payer: Self-pay | Admitting: Emergency Medicine

## 2013-10-17 ENCOUNTER — Emergency Department (HOSPITAL_COMMUNITY): Payer: BC Managed Care – PPO

## 2013-10-17 ENCOUNTER — Observation Stay (HOSPITAL_COMMUNITY)
Admission: EM | Admit: 2013-10-17 | Discharge: 2013-10-18 | Disposition: A | Discharge status: Home / Self Care | Payer: BC Managed Care – PPO | Attending: Internal Medicine | Admitting: Internal Medicine

## 2013-10-17 DIAGNOSIS — N289 Disorder of kidney and ureter, unspecified: Secondary | ICD-10-CM | POA: Insufficient documentation

## 2013-10-17 DIAGNOSIS — A64 Unspecified sexually transmitted disease: Secondary | ICD-10-CM

## 2013-10-17 DIAGNOSIS — J45901 Unspecified asthma with (acute) exacerbation: Secondary | ICD-10-CM | POA: Insufficient documentation

## 2013-10-17 DIAGNOSIS — F3289 Other specified depressive episodes: Secondary | ICD-10-CM | POA: Insufficient documentation

## 2013-10-17 DIAGNOSIS — K219 Gastro-esophageal reflux disease without esophagitis: Secondary | ICD-10-CM

## 2013-10-17 DIAGNOSIS — N6320 Unspecified lump in the left breast, unspecified quadrant: Secondary | ICD-10-CM

## 2013-10-17 DIAGNOSIS — E8881 Metabolic syndrome: Secondary | ICD-10-CM

## 2013-10-17 DIAGNOSIS — IMO0002 Reserved for concepts with insufficient information to code with codable children: Secondary | ICD-10-CM | POA: Insufficient documentation

## 2013-10-17 DIAGNOSIS — R079 Chest pain, unspecified: Secondary | ICD-10-CM | POA: Diagnosis present

## 2013-10-17 DIAGNOSIS — D509 Iron deficiency anemia, unspecified: Secondary | ICD-10-CM | POA: Diagnosis present

## 2013-10-17 DIAGNOSIS — B009 Herpesviral infection, unspecified: Secondary | ICD-10-CM

## 2013-10-17 DIAGNOSIS — F329 Major depressive disorder, single episode, unspecified: Secondary | ICD-10-CM | POA: Insufficient documentation

## 2013-10-17 DIAGNOSIS — D219 Benign neoplasm of connective and other soft tissue, unspecified: Secondary | ICD-10-CM

## 2013-10-17 DIAGNOSIS — C9000 Multiple myeloma not having achieved remission: Secondary | ICD-10-CM | POA: Insufficient documentation

## 2013-10-17 DIAGNOSIS — J45990 Exercise induced bronchospasm: Secondary | ICD-10-CM

## 2013-10-17 DIAGNOSIS — F319 Bipolar disorder, unspecified: Secondary | ICD-10-CM

## 2013-10-17 DIAGNOSIS — M310 Hypersensitivity angiitis: Secondary | ICD-10-CM | POA: Insufficient documentation

## 2013-10-17 DIAGNOSIS — Z23 Encounter for immunization: Secondary | ICD-10-CM | POA: Insufficient documentation

## 2013-10-17 DIAGNOSIS — R072 Precordial pain: Principal | ICD-10-CM | POA: Insufficient documentation

## 2013-10-17 DIAGNOSIS — R600 Localized edema: Secondary | ICD-10-CM | POA: Diagnosis present

## 2013-10-17 DIAGNOSIS — Z859 Personal history of malignant neoplasm, unspecified: Secondary | ICD-10-CM | POA: Insufficient documentation

## 2013-10-17 DIAGNOSIS — J309 Allergic rhinitis, unspecified: Secondary | ICD-10-CM

## 2013-10-17 DIAGNOSIS — R9431 Abnormal electrocardiogram [ECG] [EKG]: Secondary | ICD-10-CM

## 2013-10-17 DIAGNOSIS — N920 Excessive and frequent menstruation with regular cycle: Secondary | ICD-10-CM | POA: Diagnosis present

## 2013-10-17 DIAGNOSIS — R569 Unspecified convulsions: Secondary | ICD-10-CM

## 2013-10-17 DIAGNOSIS — J45909 Unspecified asthma, uncomplicated: Secondary | ICD-10-CM

## 2013-10-17 DIAGNOSIS — G40909 Epilepsy, unspecified, not intractable, without status epilepticus: Secondary | ICD-10-CM | POA: Insufficient documentation

## 2013-10-17 DIAGNOSIS — G43109 Migraine with aura, not intractable, without status migrainosus: Secondary | ICD-10-CM | POA: Insufficient documentation

## 2013-10-17 DIAGNOSIS — Z79899 Other long term (current) drug therapy: Secondary | ICD-10-CM | POA: Insufficient documentation

## 2013-10-17 DIAGNOSIS — D571 Sickle-cell disease without crisis: Secondary | ICD-10-CM | POA: Insufficient documentation

## 2013-10-17 HISTORY — DX: Tubal ligation status: Z98.51

## 2013-10-17 HISTORY — DX: Essential (primary) hypertension: I10

## 2013-10-17 HISTORY — DX: Acquired absence of other specified parts of digestive tract: Z90.49

## 2013-10-17 LAB — CBC
HCT: 33.9 % — ABNORMAL LOW (ref 36.0–46.0)
Hemoglobin: 10.7 g/dL — ABNORMAL LOW (ref 12.0–15.0)
MCH: 23.9 pg — AB (ref 26.0–34.0)
MCHC: 31.6 g/dL (ref 30.0–36.0)
MCV: 75.7 fL — AB (ref 78.0–100.0)
PLATELETS: 255 10*3/uL (ref 150–400)
RBC: 4.48 MIL/uL (ref 3.87–5.11)
RDW: 14.9 % (ref 11.5–15.5)
WBC: 5.5 10*3/uL (ref 4.0–10.5)

## 2013-10-17 LAB — POCT I-STAT TROPONIN I: Troponin i, poc: 0 ng/mL (ref 0.00–0.08)

## 2013-10-17 LAB — BASIC METABOLIC PANEL
BUN: 10 mg/dL (ref 6–23)
CALCIUM: 8.9 mg/dL (ref 8.4–10.5)
CHLORIDE: 105 meq/L (ref 96–112)
CO2: 24 mEq/L (ref 19–32)
CREATININE: 0.63 mg/dL (ref 0.50–1.10)
Glucose, Bld: 88 mg/dL (ref 70–99)
Potassium: 3.8 mEq/L (ref 3.7–5.3)
Sodium: 141 mEq/L (ref 137–147)

## 2013-10-17 LAB — TROPONIN I: Troponin I: <0.3 ng/mL (ref <0.30)

## 2013-10-17 LAB — D-DIMER, QUANTITATIVE (NOT AT ARMC): D DIMER QUANT: 0.63 ug{FEU}/mL — AB (ref 0.00–0.48)

## 2013-10-17 MED ORDER — ENOXAPARIN SODIUM 40 MG/0.4ML ~~LOC~~ SOLN
40.0000 mg | SUBCUTANEOUS | Status: DC
  Administered 2013-10-17: 40 mg via SUBCUTANEOUS
  Filled 2013-10-17 (×2): qty 0.4

## 2013-10-17 MED ORDER — NITROGLYCERIN 0.3 MG SL SUBL
0.3000 mg | SUBLINGUAL_TABLET | SUBLINGUAL | Status: DC | PRN
  Administered 2013-10-17: 0.3 mg via SUBLINGUAL
  Filled 2013-10-17: qty 100

## 2013-10-17 MED ORDER — PNEUMOCOCCAL VAC POLYVALENT 25 MCG/0.5ML IJ INJ
0.5000 mL | INJECTION | INTRAMUSCULAR | Status: AC
  Administered 2013-10-18: 0.5 mL via INTRAMUSCULAR
  Filled 2013-10-17: qty 0.5

## 2013-10-17 MED ORDER — ONDANSETRON HCL 4 MG/2ML IJ SOLN: 4.0000 mg | Freq: Four times a day (QID) | INTRAMUSCULAR | Status: DC | PRN

## 2013-10-17 MED ORDER — FERROUS SULFATE 325 (65 FE) MG PO TABS
325.0000 mg | ORAL_TABLET | Freq: Three times a day (TID) | ORAL | Status: DC
  Administered 2013-10-18: 325 mg via ORAL
  Filled 2013-10-17 (×5): qty 1

## 2013-10-17 MED ORDER — IOHEXOL 350 MG/ML SOLN
100.0000 mL | Freq: Once | INTRAVENOUS | Status: AC | PRN
  Administered 2013-10-17: 100 mL via INTRAVENOUS

## 2013-10-17 MED ORDER — ZOLPIDEM TARTRATE 5 MG PO TABS: 5.0000 mg | ORAL_TABLET | Freq: Every evening | ORAL | Status: DC | PRN

## 2013-10-17 MED ORDER — NITROGLYCERIN 0.4 MG SL SUBL: 0.4000 mg | SUBLINGUAL_TABLET | SUBLINGUAL | Status: DC | PRN

## 2013-10-17 MED ORDER — ACETAMINOPHEN 325 MG PO TABS: 650.0000 mg | ORAL_TABLET | ORAL | Status: DC | PRN

## 2013-10-17 MED ORDER — ASPIRIN EC 325 MG PO TBEC
325.0000 mg | DELAYED_RELEASE_TABLET | Freq: Every day | ORAL | Status: DC
  Administered 2013-10-18: 325 mg via ORAL
  Filled 2013-10-17: qty 1

## 2013-10-17 NOTE — ED Notes (Signed)
Repaged phlebotomy for troponin draw

## 2013-10-17 NOTE — ED Notes (Signed)
Pt reports increased

## 2013-10-17 NOTE — ED Notes (Signed)
[facility]: Pt reports sudden onset of "sharp" central CP when at home working rating 9/10 with mild SOB, and right jaw pain. Denies N/weakness/diaphoresis. Pt given 325 aspirin and 1 nitro, relieving pain to 4/10. Lungs clear/equal. AO x4. Hx: GERD 136 palp. 80 NSR. 99% RA.

## 2013-10-17 NOTE — ED Provider Notes (Signed)
CSN: [id number]     Arrival date & time 10/17/13  1517 History   First MD Initiated Contact with Patient 10/17/13 1518     Chief Complaint  Patient presents with  . Chest Pain   (Consider location/radiation/quality/duration/timing/severity/associated sxs/prior Treatment) Patient is a 43 y.o. female presenting with chest pain. The history is provided by the patient.  Chest Pain Pain location:  Substernal area Pain quality: sharp   Pain radiates to:  R jaw Pain radiates to the back: no   Pain severity:  Moderate Onset quality:  Sudden Duration:  2 hours Timing:  Constant Progression:  Waxing and waning Chronicity:  New Context: at rest   Relieved by:  Nothing Worsened by:  Nothing tried Ineffective treatments:  None tried Associated symptoms: shortness of breath (mild)   Associated symptoms: no abdominal pain, no back pain, no cough (mild, non-productive), no dizziness, no fatigue, no fever, no headache, no nausea and not vomiting     Past Medical History  Diagnosis Date  . Migraine     migraine is aura presenting before seizure.  . Asthma   . Seizure     stress induced; has migraine aura  . Allergy   . Depression    Past Surgical History  Procedure Laterality Date  . Tubal ligation    . Laparoscopic cholecystectomy    . Induced abortion    . Cesarean section      x2   Family History  Problem Relation Age of Onset  . Diabetes Mother   . Heart disease Mother   . Diabetes Father   . Cancer Father     THYROID  . Hypertension Father   . Hyperlipidemia Father   . Mental illness Brother     PSTD,DEPRESSION  . Migraines Son   . Allergy (severe) Son   . Diabetes Maternal Aunt   . Goiter Maternal Uncle   . Diabetes Paternal Aunt   . Alcohol abuse Paternal Uncle     ALCOHOL AND DRUGS  . Heart disease Maternal Grandmother   . Diabetes Maternal Grandmother   . Hypertension Maternal Grandmother   . Diabetes Maternal Grandfather   . Heart disease Maternal  Grandfather   . Cancer Maternal Grandfather     QUESTION WHAT KIND  . Heart disease Paternal Grandmother   . Diabetes Paternal Grandmother   . Hypertension Paternal Grandmother   . Cancer Paternal Grandmother     BREAST  . Migraines Son   . Allergy (severe) Son   . Heart disease Maternal Aunt   . Diabetes Paternal Aunt   . Arthritis Paternal Aunt    History  Substance Use Topics  . Smoking status: Never Smoker   . Smokeless tobacco: Never Used  . Alcohol Use: 0 - .5 oz/week    0-1 drink(s) per week     Comment: socially/holidays   OB History   Grav Para Term Preterm Abortions TAB SAB Ect Mult Living   3 2 2  0 1 0 0 0 0 2     Review of Systems  Constitutional: Negative for fever and fatigue.  HENT: Negative for congestion and drooling.   Eyes: Negative for pain.  Respiratory: Positive for shortness of breath (mild). Negative for cough (mild, non-productive).   Cardiovascular: Positive for chest pain.  Gastrointestinal: Negative for nausea, vomiting, abdominal pain and diarrhea.  Genitourinary: Negative for dysuria and hematuria.  Musculoskeletal: Negative for back pain, gait problem and neck pain.  Skin: Negative for color change.  Neurological:  Negative for dizziness and headaches.  Hematological: Negative for adenopathy.  Psychiatric/Behavioral: Negative for behavioral problems.  All other systems reviewed and are negative.    Allergies  Review of patient's allergies indicates no known allergies.  Home Medications   Current Outpatient Rx  Name  Route  Sig  Dispense  Refill  . albuterol (PROVENTIL HFA;VENTOLIN HFA) 108 (90 BASE) MCG/ACT inhaler   Inhalation   Inhale 2 puffs into the lungs every 4 (four) hours as needed. For shortness of breath   18 g   12   . amoxicillin-clavulanate (AUGMENTIN) 875-125 MG per tablet   Oral   Take 1 tablet by mouth 2 (two) times daily.   20 tablet   0   . budesonide (PULMICORT) 180 MCG/ACT inhaler   Inhalation   Inhale  2 puffs into the lungs 2 (two) times daily.          . chlorpheniramine-HYDROcodone (TUSSIONEX PENNKINETIC ER) 10-8 MG/5ML LQCR   Oral   Take 5 mLs by mouth every 12 (twelve) hours as needed.   60 mL   0   . esomeprazole (NEXIUM) 40 MG capsule   Oral   Take 40 mg by mouth daily before breakfast.           . fluticasone (FLONASE) 50 MCG/ACT nasal spray   Nasal   Place 1-2 sprays into the nose at bedtime as needed.          . Guaifenesin (MUCINEX MAXIMUM STRENGTH) 1200 MG TB12   Oral   Take 1 tablet (1,200 mg total) by mouth every 12 (twelve) hours as needed.   14 tablet   1   . EXPIRED: ipratropium (ATROVENT) 0.03 % nasal spray   Nasal   Place 2 sprays into the nose 2 (two) times daily.   30 mL   0   . Multiple Vitamins-Minerals (ALIVE WOMENS ENERGY PO)   Oral   Take 1 tablet by mouth daily.           . pseudoephedrine-guaifenesin (MUCINEX D) 60-600 MG per tablet   Oral   Take 1 tablet by mouth every 12 (twelve) hours.   18 tablet   0    There were no vitals taken for this visit. Physical Exam  Nursing note and vitals reviewed. Constitutional: She is oriented to person, place, and time. She appears well-developed and well-nourished.  HENT:  Head: Normocephalic.  Mouth/Throat: Oropharynx is clear and moist. No oropharyngeal exudate.  Eyes: Conjunctivae and EOM are normal. Pupils are equal, round, and reactive to light.  Neck: Normal range of motion. Neck supple.  Cardiovascular: Normal rate, regular rhythm, normal heart sounds and intact distal pulses.  Exam reveals no gallop and no friction rub.   No murmur heard. Pulmonary/Chest: Effort normal and breath sounds normal. No respiratory distress. She has no wheezes.  Abdominal: Soft. Bowel sounds are normal. There is no tenderness. There is no rebound and no guarding.  Musculoskeletal: Normal range of motion. She exhibits no edema and no tenderness.  Symmetrical lower extremities without tenderness to  palpation.  2+ distal pulses in all extremities.  Neurological: She is alert and oriented to person, place, and time.  Skin: Skin is warm and dry.  Psychiatric: She has a normal mood and affect. Her behavior is normal.    ED Course  Procedures (including critical care time) Labs Review Labs Reviewed  CBC - Abnormal; Notable for the following:    Hemoglobin 10.7 (*)    HCT 33.9 (*)  MCV 75.7 (*)    MCH 23.9 (*)    All other components within normal limits  D-DIMER, QUANTITATIVE - Abnormal; Notable for the following:    D-Dimer, Quant 0.63 (*)    All other components within normal limits  CBC WITH DIFFERENTIAL - Abnormal; Notable for the following:    Hemoglobin 10.7 (*)    HCT 33.5 (*)    MCV 75.3 (*)    MCH 24.0 (*)    Neutrophils Relative % 40 (*)    Eosinophils Relative 7 (*)    All other components within normal limits  COMPREHENSIVE METABOLIC PANEL - Abnormal; Notable for the following:    Potassium 3.6 (*)    Glucose, Bld 116 (*)    Albumin 3.1 (*)    Total Bilirubin 0.2 (*)    All other components within normal limits  BASIC METABOLIC PANEL  TROPONIN I  TROPONIN I  LIPID PANEL  TROPONIN I  HEMOGLOBIN A1C  TSH  FERRITIN  IRON AND TIBC  FOLATE  VITAMIN B12  POCT I-STAT TROPONIN I   Imaging Review Dg Chest 2 View  10/17/2013   CLINICAL DATA:  Chest pain  EXAM: CHEST  2 VIEW  COMPARISON:  None.  FINDINGS: The heart size and mediastinal contours are within normal limits. Both lungs are clear. The visualized skeletal structures are unremarkable.  IMPRESSION: No active cardiopulmonary disease.   Electronically Signed   By: Kerby Moors M.D.   On: 10/17/2013 16:40   Ct Angio Chest Pe W/cm &/or Wo Cm  10/17/2013   CLINICAL DATA:  Chest pain  EXAM: CT ANGIOGRAPHY CHEST WITH CONTRAST  TECHNIQUE: Multidetector CT imaging of the chest was performed using the standard protocol during bolus administration of intravenous contrast. Multiplanar CT image reconstructions  including MIPs were obtained to evaluate the vascular anatomy.  CONTRAST:  160mL OMNIPAQUE IOHEXOL 350 MG/ML SOLN  COMPARISON:  October 17, 2013 chest radiograph  FINDINGS: There is no demonstrable pulmonary embolus. There is no thoracic aortic aneurysm or dissection.  The lungs are clear. There is no appreciable thoracic adenopathy. The pericardium is not thickened.  The visualized upper abdominal structures appear normal. There are no blastic or lytic bone lesions.  Review of the MIP images confirms the above findings.  IMPRESSION: No edema or consolidation.  No demonstrable pulmonary embolus.   Electronically Signed   By: Lowella Grip M.D.   On: 10/17/2013 17:57    EKG Interpretation    Date/Time:  Thursday October 17 2013 15:28:43 EST Ventricular Rate:  59 PR Interval:  122 QRS Duration: 82 QT Interval:  417 QTC Calculation: 413 R Axis:   45 Text Interpretation:  Sinus rhythm Borderline T abnormalities, anterior leads Baseline wander in lead(s) V4 Confirmed by Coleson Kant  MD, Retal Tonkinson ([id number]) on 10/17/2013 3:54:13 PM            MDM   1. Chest pain    3:46 PM 43 y.o. female presents with chest pain which began suddenly at approximately 2:20 PM today. The patient had just punched out from work and noticed the pain came on all of a sudden. She notes retrosternal sharp pain radiating to her right jaw. The pain has waxed and waned since that time. She also notes mild shortness of breath. She states that she has had intermittent sharp pains in between her shoulder blades for the last week. These pains usually last minutes at a time and then resolve spontaneously. She is usually at rest. The pain she  had today was different from the pain she's been having for the last week. She is afebrile and vital signs are unremarkable here. She has a FH of cardiac dis, but no other cardiac RF's. Atypical for cardiac and low risk for PE. BP in bilateral arms is 120's/70's on my exam. Doubt dissection. Pt has  no RF's for PE, but given sudden onset and sob will get d-dimer as PE remains on ddx. She got asa by EMS and nitro x 1 which dec her cp to a 6 from 9. Will start w/ screening labs.   D-dimer elevated. Will get CT scan of chest to eval for PE. Pain now gone on exam after 2 nitro.   CTA neg for PE. Repeat ecg shows no change.   Pt has no pcp. Although cp atypical, pt has at least 2 known RF's for cardiac dis w/ cp that radiated to her jaw and non-specific t wave repol abnormalities w/out previous ecg to compare to. Will admit to [facility] for cp r/o.   Blanchard Kelch, MD 10/18/13 1017

## 2013-10-17 NOTE — ED Notes (Signed)
Admitting MD at bedside.

## 2013-10-17 NOTE — ED Notes (Signed)
Paged phlebotomy for repeat troponin

## 2013-10-17 NOTE — H&P (Signed)
[facility] History and Physical  Patient: Holly Hopkins  [id number]  DOB: 1971-06-17  DOS: the patient was seen and examined on 10/17/2013 PCP: Eli Hose, MD  Chief Complaint: Chest pain  HPI: Holly Hopkins is a 43 y.o. female with Past medical history of hypertension. The patient is coming from home. The patient presented with complaints of on and off chest pain that has been ongoing since last one week. The pain is not associated with Exertion or movement. She mentions initially she had pain which was felt like a sharp stabbing pain lasting for 15 minutes resolving on its own located primarily in the back nonradiating not associated with shortness of breath or dizziness. Today around 2:30 PM she started having complaints of pressure-like sensation in her chest which was located substernally and radiating to her right jaw. She also felt anxiety and had cold and clammy hands. But denies any shortness of breath dizziness lightheadedness or nausea. This was also nonexertional. The pain lasted until the EMS arrived and gave her sublingual nitroglycerin which improved her pain but did not resolve it and on arrival to the ED she received 2 more sublingual nitroglycerin which completely resolved the pain and currently the patient is chest pain-free. She mentions she hasn't seen her PCP in the last 3 years. She mention that she has bilateral leg swelling that has been ongoing since last 6 months and staying about the same. She mentions that since last 6 months she has menorrhagia, with extensive clots in cycles lasting for nearly more than 7 days. She has called her gynecologist and was going to see her in the coming week.  Review of Systems: as mentioned in the history of present illness.  A Comprehensive review of the other systems is negative.  Past Medical History  Diagnosis Date  . Migraine     migraine is aura presenting before seizure.  . Asthma   . Seizure      stress induced; has migraine aura  . Allergy   . Depression   . History of laparoscopic cholecystectomy 1991  . H/O tubal ligation 05/15/2003  . Hypertension    Past Surgical History  Procedure Laterality Date  . Tubal ligation    . Laparoscopic cholecystectomy    . Induced abortion    . Cesarean section      x2   Social History:  reports that she has never smoked. She has never used smokeless tobacco. She reports that she drinks alcohol. She reports that she does not use illicit drugs. Independent for most of her  ADL.  No Known Allergies  Family History  Problem Relation Age of Onset  . Diabetes Mother   . Heart disease Mother   . Diabetes Father   . Cancer Father     THYROID  . Hypertension Father   . Hyperlipidemia Father   . Mental illness Brother     PSTD,DEPRESSION  . Migraines Son   . Allergy (severe) Son   . Diabetes Maternal Aunt   . Goiter Maternal Uncle   . Diabetes Paternal Aunt   . Alcohol abuse Paternal Uncle     ALCOHOL AND DRUGS  . Heart disease Maternal Grandmother   . Diabetes Maternal Grandmother   . Hypertension Maternal Grandmother   . Diabetes Maternal Grandfather   . Heart disease Maternal Grandfather   . Cancer Maternal Grandfather     QUESTION WHAT KIND  . Heart disease Paternal Grandmother   . Diabetes Paternal Grandmother   .  Hypertension Paternal Grandmother   . Cancer Paternal Grandmother     BREAST  . Migraines Son   . Allergy (severe) Son   . Heart disease Maternal Aunt   . Diabetes Paternal Aunt   . Arthritis Paternal Aunt     Prior to Admission medications   Not on File    Physical Exam: Filed Vitals:   10/17/13 1830 10/17/13 1900 10/17/13 1945 10/17/13 2030  BP: 117/66 125/71 124/69 117/72  Pulse: 62 71 68 64  Resp: 19 14 18 14   SpO2: 100% 100% 100% 100%    General: Alert, Awake and Oriented to Time, Place and Person. Appear in moderate distress Eyes: PERRL ENT: Oral Mucosa clear moist. Neck: no  JVD Cardiovascular: S1 and S2 Present, no Murmur, Peripheral Pulses Present Respiratory: Bilateral Air entry equal and Decreased, Clear to Auscultation,  no Crackles,no wheezes Abdomen: Bowel Sound Present, Soft and Non tender Skin: no Rash Extremities: Bilateral Pedal edema, no calf tenderness Neurologic: Grossly Unremarkable.  Labs on Admission:  CBC:  Recent Labs Lab 10/17/13 1528  WBC 5.5  HGB 10.7*  HCT 33.9*  MCV 75.7*  PLT 255    CMP     Component Value Date/Time   NA 141 10/17/2013 1528   K 3.8 10/17/2013 1528   CL 105 10/17/2013 1528   CO2 24 10/17/2013 1528   GLUCOSE 88 10/17/2013 1528   BUN 10 10/17/2013 1528   CREATININE 0.63 10/17/2013 1528   CALCIUM 8.9 10/17/2013 1528   GFRNONAA >90 10/17/2013 1528   GFRAA >90 10/17/2013 1528    No results found for this basename: LIPASE, AMYLASE,  in the last 168 hours No results found for this basename: AMMONIA,  in the last 168 hours   Recent Labs Lab 10/17/13 2010  TROPONINI <0.30   BNP (last 3 results) No results found for this basename: PROBNP,  in the last 8760 hours  Radiological Exams on Admission: Dg Chest 2 View  10/17/2013   CLINICAL DATA:  Chest pain  EXAM: CHEST  2 VIEW  COMPARISON:  None.  FINDINGS: The heart size and mediastinal contours are within normal limits. Both lungs are clear. The visualized skeletal structures are unremarkable.  IMPRESSION: No active cardiopulmonary disease.   Electronically Signed   By: Kerby Moors M.D.   On: 10/17/2013 16:40   Ct Angio Chest Pe W/cm &/or Wo Cm  10/17/2013   CLINICAL DATA:  Chest pain  EXAM: CT ANGIOGRAPHY CHEST WITH CONTRAST  TECHNIQUE: Multidetector CT imaging of the chest was performed using the standard protocol during bolus administration of intravenous contrast. Multiplanar CT image reconstructions including MIPs were obtained to evaluate the vascular anatomy.  CONTRAST:  136mL OMNIPAQUE IOHEXOL 350 MG/ML SOLN  COMPARISON:  October 17, 2013 chest radiograph  FINDINGS:  There is no demonstrable pulmonary embolus. There is no thoracic aortic aneurysm or dissection.  The lungs are clear. There is no appreciable thoracic adenopathy. The pericardium is not thickened.  The visualized upper abdominal structures appear normal. There are no blastic or lytic bone lesions.  Review of the MIP images confirms the above findings.  IMPRESSION: No edema or consolidation.  No demonstrable pulmonary embolus.   Electronically Signed   By: Lowella Grip M.D.   On: 10/17/2013 17:57    EKG: Independently reviewed. normal sinus rhythm, T-wave inversion in lead V2 and V3.  Assessment/Plan Principal Problem:   Chest pain Active Problems:   Pedal edema   Menorrhagia   Microcytic anemia  1. Chest pain The patient is presenting with complaints of chest pain which was felt like pressure and resolved with nitroglycerin with T-wave inversions in lead V2 and V3 without any other EKG changes that could suggest acute ischemia. She has a negative troponin and she also has a negative CT chest for pulmonary embolism. With this she will be admitted for further workup and rule out ACS. Telemetry monitoring, serial troponins, echocardiogram, hemoglobin A1c, lipid profile, TSH in the morning. She will be kept n.p.o. for possible stress test after midnight. Her current chest pain could also be secondary to angina due to anemia. 2. Microscopic anemia and menorrhagia She has excessive menstrual screening and she is currently on day 5 of her menstrual period. She has newly diagnosed anemia which appears Microcytic based on low MCV. Likely secondary to excessive menstruation. Will continue to monitor her H and H and transfuse as needed. Will need gynecology referral on discharge.  3.pedal edema. She does not appear to have a new DVT as she does not have it is better and the PE scan is negative. I will scan her lower extremity in the morning. She also does not appear volume overloaded at  present. She does have questionable history of hypertension but her blood pressure is well controlled and does not require any acute management. I will get an echocardiogram and TSH for further workup.  DVT Prophylaxis: mechanical compression device Nutrition: N.p.o.  Code Status: Full  Family Communication: Mother was present at bedside, opportunity was given to ask question and all questions were answered satisfactorily at the time of interview. Disposition: Admitted to observation in telemetry unit.  Author: Berle Mull, MD [facility] Pager: [phone] 10/17/2013, 9:19 PM    If 7PM-7AM, please contact night-coverage [email or web address] Password [email or web address]

## 2013-10-18 DIAGNOSIS — M79609 Pain in unspecified limb: Secondary | ICD-10-CM

## 2013-10-18 DIAGNOSIS — R072 Precordial pain: Secondary | ICD-10-CM

## 2013-10-18 DIAGNOSIS — R079 Chest pain, unspecified: Secondary | ICD-10-CM

## 2013-10-18 DIAGNOSIS — N920 Excessive and frequent menstruation with regular cycle: Secondary | ICD-10-CM

## 2013-10-18 DIAGNOSIS — R9431 Abnormal electrocardiogram [ECG] [EKG]: Secondary | ICD-10-CM

## 2013-10-18 DIAGNOSIS — M7989 Other specified soft tissue disorders: Secondary | ICD-10-CM

## 2013-10-18 DIAGNOSIS — D509 Iron deficiency anemia, unspecified: Secondary | ICD-10-CM

## 2013-10-18 LAB — IRON AND TIBC
Iron: 51 ug/dL (ref 42–135)
Saturation Ratios: 14 % — ABNORMAL LOW (ref 20–55)
TIBC: 359 ug/dL (ref 250–470)
UIBC: 308 ug/dL (ref 125–400)

## 2013-10-18 LAB — HEMOGLOBIN A1C
HEMOGLOBIN A1C: 6.6 % — AB (ref <5.7)
Mean Plasma Glucose: 143 mg/dL — ABNORMAL HIGH (ref <117)

## 2013-10-18 LAB — LIPID PANEL
Cholesterol: 138 mg/dL (ref 0–200)
HDL: 56 mg/dL (ref >39)
LDL CALC: 59 mg/dL (ref 0–99)
Total CHOL/HDL Ratio: 2.5 RATIO
Triglycerides: 117 mg/dL (ref <150)
VLDL: 23 mg/dL (ref 0–40)

## 2013-10-18 LAB — COMPREHENSIVE METABOLIC PANEL
ALT: 10 U/L (ref 0–35)
AST: 14 U/L (ref 0–37)
Albumin: 3.1 g/dL — ABNORMAL LOW (ref 3.5–5.2)
Alkaline Phosphatase: 78 U/L (ref 39–117)
BUN: 9 mg/dL (ref 6–23)
CALCIUM: 8.5 mg/dL (ref 8.4–10.5)
CO2: 25 mEq/L (ref 19–32)
Chloride: 106 mEq/L (ref 96–112)
Creatinine, Ser: 0.73 mg/dL (ref 0.50–1.10)
GLUCOSE: 116 mg/dL — AB (ref 70–99)
Potassium: 3.6 mEq/L — ABNORMAL LOW (ref 3.7–5.3)
Sodium: 142 mEq/L (ref 137–147)
TOTAL PROTEIN: 6.5 g/dL (ref 6.0–8.3)
Total Bilirubin: 0.2 mg/dL — ABNORMAL LOW (ref 0.3–1.2)

## 2013-10-18 LAB — VITAMIN B12: Vitamin B-12: 796 pg/mL (ref 211–911)

## 2013-10-18 LAB — FOLATE: Folate: 14.5 ng/mL

## 2013-10-18 LAB — CBC WITH DIFFERENTIAL/PLATELET
Basophils Absolute: 0 10*3/uL (ref 0.0–0.1)
Basophils Relative: 1 % (ref 0–1)
EOS ABS: 0.4 10*3/uL (ref 0.0–0.7)
EOS PCT: 7 % — AB (ref 0–5)
HEMATOCRIT: 33.5 % — AB (ref 36.0–46.0)
HEMOGLOBIN: 10.7 g/dL — AB (ref 12.0–15.0)
LYMPHS ABS: 2.4 10*3/uL (ref 0.7–4.0)
LYMPHS PCT: 44 % (ref 12–46)
MCH: 24 pg — AB (ref 26.0–34.0)
MCHC: 31.9 g/dL (ref 30.0–36.0)
MCV: 75.3 fL — AB (ref 78.0–100.0)
MONO ABS: 0.5 10*3/uL (ref 0.1–1.0)
MONOS PCT: 8 % (ref 3–12)
Neutro Abs: 2.2 10*3/uL (ref 1.7–7.7)
Neutrophils Relative %: 40 % — ABNORMAL LOW (ref 43–77)
Platelets: 246 10*3/uL (ref 150–400)
RBC: 4.45 MIL/uL (ref 3.87–5.11)
RDW: 15 % (ref 11.5–15.5)
WBC: 5.6 10*3/uL (ref 4.0–10.5)

## 2013-10-18 LAB — TSH: TSH: 1.19 u[IU]/mL (ref 0.350–4.500)

## 2013-10-18 LAB — TROPONIN I
Troponin I: <0.3 ng/mL (ref <0.30)
Troponin I: <0.3 ng/mL (ref <0.30)

## 2013-10-18 LAB — FERRITIN: FERRITIN: 14 ng/mL (ref 10–291)

## 2013-10-18 MED ORDER — FERROUS SULFATE 325 (65 FE) MG PO TABS: 325.0000 mg | ORAL_TABLET | Freq: Every day | ORAL | Status: DC

## 2013-10-18 MED ORDER — IBUPROFEN 200 MG PO TABS: 400.0000 mg | ORAL_TABLET | Freq: Three times a day (TID) | ORAL | Status: DC

## 2013-10-18 MED ORDER — KETOROLAC TROMETHAMINE 30 MG/ML IJ SOLN
30.0000 mg | Freq: Once | INTRAMUSCULAR | Status: AC
  Administered 2013-10-18: 30 mg via INTRAVENOUS
  Filled 2013-10-18: qty 1

## 2013-10-18 NOTE — Consult Note (Addendum)
CARDIOLOGY CONSULT NOTE  Patient ID: Holly Hopkins MRN: [id number] DOB/AGE: 06-17-1971 43 y.o.  Admit date: 10/17/2013 Primary Cardiologist: New Reason for Consultation: Chest pain  HPI: 43 yo with history of HTN presented yesterday with chest pain.  Patient has no history of cardiac disease.  She was working from home yesterday afternoon.  She had just sat down to eat lunch when she noted relatively mild stabbing pain in her central chest.  The worsened to be severe stabbing pain after about 5-10 minutes.  She was short of breath.  The pain was worse if she moved around but was not pleuritic.  She called EMS.  She was given NTG which helped a little but did not resolve the pain.  She has continued to have the stabbing pain (waxing and waning) since that time.  This morning, it is 3/10.  She has had negative cardiac enzymes.  ECG shows shallow anterior T wave inversions.  I do not have an old ECG to compare.  Her mother had CHF and her grandmother had an MI.  She does not smoke.  She does not have diabetes. Prior to yesterday, no chest pain.  She does not have exercise limitations. CTA chest was negative for PE, dissection, or PNA.   Review of systems complete and found to be negative unless listed above in HPI  Past Medical History: 1. HTN 2. Migraines 3. H/o seizure disorder 4. Depression 5. Cholecystectomy  Family History  Problem Relation Age of Onset  . Diabetes Mother   . Heart disease Mother   . Diabetes Father   . Cancer Father     THYROID  . Hypertension Father   . Hyperlipidemia Father   . Mental illness Brother     PSTD,DEPRESSION  . Migraines Son   . Allergy (severe) Son   . Diabetes Maternal Aunt   . Goiter Maternal Uncle   . Diabetes Paternal Aunt   . Alcohol abuse Paternal Uncle     ALCOHOL AND DRUGS  . Heart disease Maternal Grandmother   . Diabetes Maternal Grandmother   . Hypertension Maternal Grandmother   . Diabetes Maternal Grandfather   . Heart  disease Maternal Grandfather   . Cancer Maternal Grandfather     QUESTION WHAT KIND  . Heart disease Paternal Grandmother   . Diabetes Paternal Grandmother   . Hypertension Paternal Grandmother   . Cancer Paternal Grandmother     BREAST  . Migraines Son   . Allergy (severe) Son   . Heart disease Maternal Aunt   . Diabetes Paternal Aunt   . Arthritis Paternal Aunt     History   Social History  . Marital Status: Single    Spouse Name: n/a    Number of Children: 2  . Years of Education: 14   Occupational History  . Designer, industrial/product Rep [employer] History Main Topics  . Smoking status: Never Smoker   . Smokeless tobacco: Never Used  . Alcohol Use: 0 - .5 oz/week    0-1 drink(s) per week     Comment: socially/holidays  . Drug Use: No  . Sexual Activity: Not Currently    Partners: Male    Birth Control/ Protection: Surgical     Comment: BTL   Other Topics Concern  . Not on file   Social History Narrative  . No narrative on file     No prescriptions prior to admission    Physical exam  Blood pressure 109/62, pulse 71, temperature 97.7 F (36.5 C), temperature source Oral, resp. rate 18, height 5\' 4"  (1.626 m), weight 108.41 kg (239 lb), last menstrual period 10/16/2013, SpO2 100.00%. General: NAD Neck: No JVD, no thyromegaly or thyroid nodule.  Lungs: Clear to auscultation bilaterally with normal respiratory effort. CV: Nondisplaced PMI.  Heart regular S1/S2, no S3/S4, no murmur.  No peripheral edema.  No carotid bruit.  Normal pedal pulses.  Abdomen: Soft, nontender, no hepatosplenomegaly, no distention.  Skin: Intact without lesions or rashes.  Neurologic: Alert and oriented x 3.  Psych: Normal affect. Extremities: No clubbing or cyanosis.  HEENT: Normal.   Labs:   Lab Results  Component Value Date   WBC 5.6 10/18/2013   HGB 10.7* 10/18/2013   HCT 33.5* 10/18/2013   MCV 75.3* 10/18/2013   PLT 246 10/18/2013    Recent Labs Lab  10/18/13 0500  NA 142  K 3.6*  CL 106  CO2 25  BUN 9  CREATININE 0.73  CALCIUM 8.5  PROT 6.5  BILITOT 0.2*  ALKPHOS 78  ALT 10  AST 14  GLUCOSE 116*   Lab Results  Component Value Date   TROPONINI <0.30 10/18/2013    Lab Results  Component Value Date   CHOL 138 10/18/2013   Lab Results  Component Value Date   HDL 56 10/18/2013   Lab Results  Component Value Date   LDLCALC 59 10/18/2013   Lab Results  Component Value Date   TRIG 117 10/18/2013   Lab Results  Component Value Date   CHOLHDL 2.5 10/18/2013   No results found for this basename: LDLDIRECT      Radiology: - CTA chest: No PE, no dissection, no PNA  EKG: NSR, shallow TWIs in the anterior leads.   ASSESSMENT AND PLAN:  43 yo with history of HTN presented with chest pain that has been constant (waxing and waning) since yesterday afternoon.  It is now quite mild. Cardiac enzymes negative. ECG is abnormal with shallow anterior TWIs but I do not have a prior to compare.  Her CTA chest showed no PNA, PE, or dissection.  I think that it would be reasonable to risk stratify her (suspect noncardiac chest pain but ECG is not normal).  I will not do a coronary CTA as she just got a chest CTA for PE last night.   I will arrange for stress echo.  If this is normal, no further cardiac workup needed.   Loralie Champagne 10/18/2013 10:41 AM

## 2013-10-18 NOTE — Progress Notes (Signed)
UR completed 

## 2013-10-18 NOTE — Discharge Summary (Signed)
Physician Discharge Summary  Holly Hopkins GYI:[id number] DOB: June 21, 1971 DOA: 10/17/2013  PCP: Eli Hose, MD  Admit date: 10/17/2013 Discharge date: 10/18/2013  Recommendations for Outpatient Follow-up:  1. F/u with gyn 2. Monitor h/h  Discharge Diagnoses:  Principal Problem:   Chest pain Active Problems:   Pedal edema   Menorrhagia   Microcytic anemia   Discharge Condition stable  Filed Weights   10/17/13 2129  Weight: 108.41 kg (239 lb)    History of present illness:  43 y.o. female with Past medical history of hypertension.  The patient is coming from home.  The patient presented with complaints of on and off chest pain that has been ongoing since last one week. The pain is not associated with Exertion or movement. She mentions initially she had pain which was felt like a sharp stabbing pain lasting for 15 minutes resolving on its own located primarily in the back nonradiating not associated with shortness of breath or dizziness.  Today around 2:30 PM she started having complaints of pressure-like sensation in her chest which was located substernally and radiating to her right jaw. She also felt anxiety and had cold and clammy hands. But denies any shortness of breath dizziness lightheadedness or nausea. This was also nonexertional.  The pain lasted until the EMS arrived and gave her sublingual nitroglycerin which improved her pain but did not resolve it and on arrival to the ED she received 2 more sublingual nitroglycerin which completely resolved the pain and currently the patient is chest pain-free.  She mentions she hasn't seen her PCP in the last 3 years.  She mention that she has bilateral leg swelling that has been ongoing since last 6 months and staying about the same.  She mentions that since last 6 months she has menorrhagia, with extensive clots in cycles lasting for nearly more than 7 days. She has called her gynecologist and was going to see her in the coming  week.   Hospital Course:  Observed on tele. MI and PE ruled out.  Doppler legs negative.  Stress echo normal. No further chest pain.  Procedures:  none  Consultations:  cardiology  Discharge Exam: Filed Vitals:   10/18/13 0500  BP: 109/62  Pulse: 71  Temp: 97.7 F (36.5 C)  Resp: 18    General: comfortable Cardiovascular: RRR Respiratory: CTA Abd: s, nt, nd No CCE  Discharge Instructions  Discharge Orders   Future Orders Complete By Expires   Activity as tolerated - No restrictions  As directed    Diet general  As directed        Medication List         ferrous sulfate 325 (65 FE) MG tablet  Take 1 tablet (325 mg total) by mouth daily with breakfast.     ibuprofen 200 MG tablet  Commonly known as:  ADVIL  Take 2 tablets (400 mg total) by mouth 3 (three) times daily with meals. For 2 - 3 days then as needed       No Known Allergies     Follow-up Information   Follow up with Eli Hose, MD. (As needed)    Specialty:  Obstetrics and Gynecology   Contact information:   [address] [place] [address] [phone]        The results of significant diagnostics from this hospitalization (including imaging, microbiology, ancillary and laboratory) are listed below for reference.    Significant Diagnostic Studies: Dg Chest 2 View  10/17/2013  CLINICAL DATA:  Chest pain  EXAM: CHEST  2 VIEW  COMPARISON:  None.  FINDINGS: The heart size and mediastinal contours are within normal limits. Both lungs are clear. The visualized skeletal structures are unremarkable.  IMPRESSION: No active cardiopulmonary disease.   Electronically Signed   By: Kerby Moors M.D.   On: 10/17/2013 16:40   Ct Angio Chest Pe W/cm &/or Wo Cm  10/17/2013   CLINICAL DATA:  Chest pain  EXAM: CT ANGIOGRAPHY CHEST WITH CONTRAST  TECHNIQUE: Multidetector CT imaging of the chest was performed using the standard protocol during bolus administration of intravenous  contrast. Multiplanar CT image reconstructions including MIPs were obtained to evaluate the vascular anatomy.  CONTRAST:  173mL OMNIPAQUE IOHEXOL 350 MG/ML SOLN  COMPARISON:  October 17, 2013 chest radiograph  FINDINGS: There is no demonstrable pulmonary embolus. There is no thoracic aortic aneurysm or dissection.  The lungs are clear. There is no appreciable thoracic adenopathy. The pericardium is not thickened.  The visualized upper abdominal structures appear normal. There are no blastic or lytic bone lesions.  Review of the MIP images confirms the above findings.  IMPRESSION: No edema or consolidation.  No demonstrable pulmonary embolus.   Electronically Signed   By: Lowella Grip M.D.   On: 10/17/2013 17:57   EKG Sinus rhythm Borderline T abnormalities, anterior leads Baseline wander in lead(s) V4  Stress echo Normal  Venous doppler, legs, normal  Microbiology: No results found for this or any previous visit (from the past 240 hour(s)).   Labs: Basic Metabolic Panel:  Recent Labs Lab 10/17/13 1528 10/18/13 0500  NA 141 142  K 3.8 3.6*  CL 105 106  CO2 24 25  GLUCOSE 88 116*  BUN 10 9  CREATININE 0.63 0.73  CALCIUM 8.9 8.5   Liver Function Tests:  Recent Labs Lab 10/18/13 0500  AST 14  ALT 10  ALKPHOS 78  BILITOT 0.2*  PROT 6.5  ALBUMIN 3.1*   No results found for this basename: LIPASE, AMYLASE,  in the last 168 hours No results found for this basename: AMMONIA,  in the last 168 hours CBC:  Recent Labs Lab 10/17/13 1528 10/18/13 0500  WBC 5.5 5.6  NEUTROABS  --  2.2  HGB 10.7* 10.7*  HCT 33.9* 33.5*  MCV 75.7* 75.3*  PLT 255 246   Cardiac Enzymes:  Recent Labs Lab 10/17/13 2010 10/18/13 0500 10/18/13 1058  TROPONINI <0.30 <0.30 <0.30   BNP: BNP (last 3 results) No results found for this basename: PROBNP,  in the last 8760 hours CBG: No results found for this basename: GLUCAP,  in the last 168 hours     Signed:  [place]  L  [facility] 10/18/2013, 6:42 PM

## 2013-10-18 NOTE — Progress Notes (Signed)
Echocardiogram 2D Echocardiogram Stress Test has been performed.  Joelene Millin 10/18/2013, 2:41 PM

## 2013-10-18 NOTE — Progress Notes (Signed)
Patient received Discharge instructions with no further questions. Patient discharged home with family.

## 2013-10-18 NOTE — Progress Notes (Signed)
*  Preliminary Results* Bilateral lower extremity venous duplex completed. Bilateral lower extremities are negative for deep vein thrombosis. There is no evidence of Baker's cyst bilaterally.  10/18/2013  Maudry Mayhew, RVT, RDCS, RDMS

## 2013-10-29 ENCOUNTER — Other Ambulatory Visit: Payer: Self-pay

## 2013-11-12 ENCOUNTER — Encounter (HOSPITAL_COMMUNITY): Payer: Self-pay | Admitting: *Deleted

## 2013-11-14 ENCOUNTER — Other Ambulatory Visit: Payer: Self-pay | Admitting: Obstetrics and Gynecology

## 2013-11-14 ENCOUNTER — Encounter (HOSPITAL_COMMUNITY): Payer: Self-pay

## 2013-11-28 ENCOUNTER — Other Ambulatory Visit: Payer: Self-pay | Admitting: Obstetrics and Gynecology

## 2013-11-28 NOTE — H&P (Signed)
Admission History and Physical Exam for a Gynecology Patient  [name] is a 43 y.o. female, [id number], who presents for hysteroscopy with resection of a submucosal myoma. The patient complains of menorrhagia and anemia. An ultrasound showed a 1.4 submucosal fibroid. She has had a tubal ligation. She has been followed at the [facility] and Gynecology division of [facility].  OB History   Grav Para Term Preterm Abortions TAB SAB Ect Mult Living   3 2 2  0 1 0 0 0 0 2      Past Medical History  Diagnosis Date  . Migraine     migraine is aura presenting before seizure.  . Seizure     stress induced; has migraine aura  . Allergy   . Depression   . History of laparoscopic cholecystectomy 1991  . H/O tubal ligation 05/15/2003  . Hypertension   . Asthma     allergy related.Marland Kitchen    No prescriptions prior to admission    Past Surgical History  Procedure Laterality Date  . Tubal ligation    . Laparoscopic cholecystectomy    . Induced abortion    . Cesarean section      x2    No Known Allergies  Family History: family history includes Alcohol abuse in her paternal uncle; Allergy (severe) in her son and son; Arthritis in her paternal aunt; Cancer in her father, maternal grandfather, and paternal grandmother; Diabetes in her father, maternal aunt, maternal grandfather, maternal grandmother, mother, paternal aunt, paternal aunt, and paternal grandmother; Goiter in her maternal uncle; Heart disease in her maternal aunt, maternal grandfather, maternal grandmother, mother, and paternal grandmother; Hyperlipidemia in her father; Hypertension in her father, maternal grandmother, and paternal grandmother; Mental illness in her brother; Migraines in her son and son.  Social History:  reports that she has never smoked. She has never used smokeless tobacco. She reports that she drinks alcohol. She reports that she does not use illicit drugs.  Review of  systems: See HPI.  Admission Physical Exam:    There is no weight on file to calculate BMI.  There were no vitals taken for this visit.  HEENT:                 Within normal limits Chest:                   Clear Heart:                    Regular rate and rhythm Breasts:                No masses, skin changes, bleeding, or discharge present Abdomen:             Nontender, no masses Extremities:          Grossly normal Neurologic exam: Grossly normal  Pelvic exam:  External genitalia: normal general appearance Vaginal: normal without tenderness, induration or masses Cervix: normal appearance Adnexa: normal bimanual exam Uterus: Normal size, shape, and consistency. The uterus is difficult to outline because of the patient's increased BMI. Rectal: good sphincter tone and no masses  Assessment:  Menorrhagia  Submucosal fibroid  BMI equals 41.9 (obesity)  Anemia  Asthma  Hypertension  Gastroesophageal reflux disease  Diabetes  Plan:  The patient will undergo hysteroscopy with resection of a submucosal fibroid. She will also have a dilatation and curettage. She will have a NovaSure ablation of the endometrium. The patient understands  and accepts the indications for her surgical procedure.   Eli Hose 11/28/2013

## 2013-11-29 ENCOUNTER — Ambulatory Visit (HOSPITAL_COMMUNITY): Payer: BC Managed Care – PPO | Admitting: Anesthesiology

## 2013-11-29 ENCOUNTER — Encounter (HOSPITAL_COMMUNITY): Payer: BC Managed Care – PPO | Admitting: Anesthesiology

## 2013-11-29 ENCOUNTER — Encounter (HOSPITAL_COMMUNITY)
Admission: RE | Disposition: A | Discharge status: Home / Self Care | Payer: Self-pay | Source: Ambulatory Visit | Attending: Obstetrics and Gynecology

## 2013-11-29 ENCOUNTER — Ambulatory Visit (HOSPITAL_COMMUNITY)
Admission: RE | Admit: 2013-11-29 | Discharge: 2013-11-29 | Disposition: A | Discharge status: Home / Self Care | Payer: BC Managed Care – PPO | Source: Ambulatory Visit | Attending: Obstetrics and Gynecology | Admitting: Obstetrics and Gynecology

## 2013-11-29 ENCOUNTER — Encounter (HOSPITAL_COMMUNITY): Payer: Self-pay | Admitting: *Deleted

## 2013-11-29 DIAGNOSIS — D649 Anemia, unspecified: Secondary | ICD-10-CM | POA: Insufficient documentation

## 2013-11-29 DIAGNOSIS — D250 Submucous leiomyoma of uterus: Secondary | ICD-10-CM | POA: Insufficient documentation

## 2013-11-29 DIAGNOSIS — K219 Gastro-esophageal reflux disease without esophagitis: Secondary | ICD-10-CM | POA: Insufficient documentation

## 2013-11-29 DIAGNOSIS — E119 Type 2 diabetes mellitus without complications: Secondary | ICD-10-CM | POA: Insufficient documentation

## 2013-11-29 DIAGNOSIS — I10 Essential (primary) hypertension: Secondary | ICD-10-CM | POA: Insufficient documentation

## 2013-11-29 DIAGNOSIS — Z9851 Tubal ligation status: Secondary | ICD-10-CM | POA: Insufficient documentation

## 2013-11-29 DIAGNOSIS — N920 Excessive and frequent menstruation with regular cycle: Secondary | ICD-10-CM | POA: Insufficient documentation

## 2013-11-29 DIAGNOSIS — Z6841 Body Mass Index (BMI) 40.0 and over, adult: Secondary | ICD-10-CM | POA: Insufficient documentation

## 2013-11-29 DIAGNOSIS — R569 Unspecified convulsions: Secondary | ICD-10-CM | POA: Insufficient documentation

## 2013-11-29 DIAGNOSIS — J45909 Unspecified asthma, uncomplicated: Secondary | ICD-10-CM | POA: Insufficient documentation

## 2013-11-29 HISTORY — PX: DILITATION & CURRETTAGE/HYSTROSCOPY WITH NOVASURE ABLATION: SHX5568

## 2013-11-29 LAB — CBC
HEMATOCRIT: 38.2 % (ref 36.0–46.0)
HEMOGLOBIN: 12.3 g/dL (ref 12.0–15.0)
MCH: 24.5 pg — ABNORMAL LOW (ref 26.0–34.0)
MCHC: 32.2 g/dL (ref 30.0–36.0)
MCV: 75.9 fL — ABNORMAL LOW (ref 78.0–100.0)
Platelets: 249 10*3/uL (ref 150–400)
RBC: 5.03 MIL/uL (ref 3.87–5.11)
RDW: 16.5 % — ABNORMAL HIGH (ref 11.5–15.5)
WBC: 6.7 10*3/uL (ref 4.0–10.5)

## 2013-11-29 LAB — PREGNANCY, URINE: Preg Test, Ur: NEGATIVE

## 2013-11-29 SURGERY — DILATATION & CURETTAGE/HYSTEROSCOPY WITH NOVASURE ABLATION
Anesthesia: General | Site: Uterus

## 2013-11-29 MED ORDER — GLYCOPYRROLATE 0.2 MG/ML IJ SOLN
INTRAMUSCULAR | Status: AC
  Filled 2013-11-29: qty 2

## 2013-11-29 MED ORDER — LIDOCAINE HCL (CARDIAC) 20 MG/ML IV SOLN
INTRAVENOUS | Status: DC | PRN
  Administered 2013-11-29: 30 mg via INTRAVENOUS
  Administered 2013-11-29: 70 mg via INTRAVENOUS

## 2013-11-29 MED ORDER — KETOROLAC TROMETHAMINE 30 MG/ML IJ SOLN
INTRAMUSCULAR | Status: DC | PRN
  Administered 2013-11-29 (×2): 30 mg via INTRAVENOUS

## 2013-11-29 MED ORDER — ONDANSETRON HCL 4 MG/2ML IJ SOLN
INTRAMUSCULAR | Status: DC | PRN
  Administered 2013-11-29: 4 mg via INTRAVENOUS

## 2013-11-29 MED ORDER — LIDOCAINE 1%/NA BICARB 0.1 MEQ INJECTION
INJECTION | INTRAVENOUS | Status: AC
  Filled 2013-11-29: qty 1

## 2013-11-29 MED ORDER — PROPOFOL 10 MG/ML IV EMUL
INTRAVENOUS | Status: AC
  Filled 2013-11-29: qty 20

## 2013-11-29 MED ORDER — ACETAMINOPHEN 160 MG/5ML PO SOLN: 325.0000 mg | ORAL | Status: DC | PRN

## 2013-11-29 MED ORDER — KETOROLAC TROMETHAMINE 30 MG/ML IJ SOLN: 15.0000 mg | Freq: Once | INTRAMUSCULAR | Status: DC | PRN

## 2013-11-29 MED ORDER — PROPOFOL 10 MG/ML IV BOLUS
INTRAVENOUS | Status: DC | PRN
  Administered 2013-11-29: 180 mg via INTRAVENOUS

## 2013-11-29 MED ORDER — FENTANYL CITRATE 0.05 MG/ML IJ SOLN
INTRAMUSCULAR | Status: DC | PRN
  Administered 2013-11-29: 50 ug via INTRAVENOUS
  Administered 2013-11-29: 25 ug via INTRAVENOUS
  Administered 2013-11-29: 50 ug via INTRAVENOUS
  Administered 2013-11-29: 25 ug via INTRAVENOUS

## 2013-11-29 MED ORDER — IBUPROFEN 800 MG PO TABS: 800.0000 mg | ORAL_TABLET | Freq: Three times a day (TID) | ORAL | Status: DC | PRN

## 2013-11-29 MED ORDER — LACTATED RINGERS IV SOLN
INTRAVENOUS | Status: DC | PRN
  Administered 2013-11-29: 3000 mL

## 2013-11-29 MED ORDER — OXYCODONE-ACETAMINOPHEN 5-325 MG PO TABS: 1.0000 | ORAL_TABLET | ORAL | Status: DC | PRN

## 2013-11-29 MED ORDER — FENTANYL CITRATE 0.05 MG/ML IJ SOLN: 25.0000 ug | INTRAMUSCULAR | Status: DC | PRN

## 2013-11-29 MED ORDER — GLYCINE 1.5 % IR SOLN
Status: DC | PRN
  Administered 2013-11-29 (×2): 3000 mL

## 2013-11-29 MED ORDER — ONDANSETRON HCL 4 MG/2ML IJ SOLN
INTRAMUSCULAR | Status: AC
  Filled 2013-11-29: qty 2

## 2013-11-29 MED ORDER — MIDAZOLAM HCL 2 MG/2ML IJ SOLN
INTRAMUSCULAR | Status: AC
  Filled 2013-11-29: qty 2

## 2013-11-29 MED ORDER — FENTANYL CITRATE 0.05 MG/ML IJ SOLN
INTRAMUSCULAR | Status: AC
  Filled 2013-11-29: qty 5

## 2013-11-29 MED ORDER — LIDOCAINE HCL (CARDIAC) 20 MG/ML IV SOLN
INTRAVENOUS | Status: AC
  Filled 2013-11-29: qty 5

## 2013-11-29 MED ORDER — KETOROLAC TROMETHAMINE 30 MG/ML IJ SOLN
INTRAMUSCULAR | Status: AC
  Filled 2013-11-29: qty 1

## 2013-11-29 MED ORDER — BUPIVACAINE-EPINEPHRINE 0.5% -1:200000 IJ SOLN
INTRAMUSCULAR | Status: DC | PRN
  Administered 2013-11-29: 10 mL

## 2013-11-29 MED ORDER — DOCUSATE SODIUM 100 MG PO CAPS: 100.0000 mg | ORAL_CAPSULE | Freq: Two times a day (BID) | ORAL | Status: DC

## 2013-11-29 MED ORDER — LACTATED RINGERS IV SOLN
INTRAVENOUS | Status: DC
  Administered 2013-11-29 (×2): via INTRAVENOUS

## 2013-11-29 MED ORDER — ONDANSETRON HCL 4 MG/2ML IJ SOLN: 4.0000 mg | Freq: Once | INTRAMUSCULAR | Status: DC | PRN

## 2013-11-29 MED ORDER — OXYCODONE HCL 5 MG/5ML PO SOLN: 5.0000 mg | Freq: Once | ORAL | Status: DC | PRN

## 2013-11-29 MED ORDER — ACETAMINOPHEN 325 MG PO TABS: 325.0000 mg | ORAL_TABLET | ORAL | Status: DC | PRN

## 2013-11-29 MED ORDER — OXYCODONE HCL 5 MG PO TABS: 5.0000 mg | ORAL_TABLET | Freq: Once | ORAL | Status: DC | PRN

## 2013-11-29 MED ORDER — MIDAZOLAM HCL 2 MG/2ML IJ SOLN
INTRAMUSCULAR | Status: DC | PRN
  Administered 2013-11-29: 2 mg via INTRAVENOUS

## 2013-11-29 MED ORDER — GLYCOPYRROLATE 0.2 MG/ML IJ SOLN
INTRAMUSCULAR | Status: DC | PRN
  Administered 2013-11-29: 0.3 mg via INTRAVENOUS

## 2013-11-29 MED ORDER — PROMETHAZINE HCL 12.5 MG PO TABS: 12.5000 mg | ORAL_TABLET | Freq: Four times a day (QID) | ORAL | Status: DC | PRN

## 2013-11-29 MED ORDER — BUPIVACAINE-EPINEPHRINE (PF) 0.5% -1:200000 IJ SOLN
INTRAMUSCULAR | Status: AC
  Filled 2013-11-29: qty 10

## 2013-11-29 SURGICAL SUPPLY — 29 items
ABLATOR ENDOMETRIAL BIPOLAR (ABLATOR) ×2 IMPLANT
CANISTER SUCT 3000ML (MISCELLANEOUS) ×3 IMPLANT
CATH ROBINSON RED A/P 16FR (CATHETERS) ×3 IMPLANT
CLOTH BEACON ORANGE TIMEOUT ST (SAFETY) ×3 IMPLANT
CONTAINER PREFILL 10% NBF 60ML (FORM) ×8 IMPLANT
DRAPE HYSTEROSCOPY (DRAPE) ×3 IMPLANT
DRSG TELFA 3X8 NADH (GAUZE/BANDAGES/DRESSINGS) ×3 IMPLANT
ELECT REM PT RETURN 9FT ADLT (ELECTROSURGICAL) ×3
ELECTRODE REM PT RTRN 9FT ADLT (ELECTROSURGICAL) IMPLANT
GLOVE BIOGEL PI IND STRL 7.0 (GLOVE) IMPLANT
GLOVE BIOGEL PI IND STRL 8.5 (GLOVE) ×1 IMPLANT
GLOVE BIOGEL PI INDICATOR 7.0 (GLOVE) ×2
GLOVE BIOGEL PI INDICATOR 8.5 (GLOVE) ×2
GLOVE ECLIPSE 6.5 STRL STRAW (GLOVE) ×2 IMPLANT
GLOVE ECLIPSE 8.0 STRL XLNG CF (GLOVE) ×6 IMPLANT
GOWN STRL REUS W/ TWL XL LVL3 (GOWN DISPOSABLE) ×1 IMPLANT
GOWN STRL REUS W/TWL LRG LVL3 (GOWN DISPOSABLE) ×3 IMPLANT
GOWN STRL REUS W/TWL XL LVL3 (GOWN DISPOSABLE) ×3
LOOP ANGLED CUTTING 22FR (CUTTING LOOP) ×2 IMPLANT
NDL SPNL 22GX3.5 QUINCKE BK (NEEDLE) ×1 IMPLANT
NEEDLE SPNL 22GX3.5 QUINCKE BK (NEEDLE) ×3 IMPLANT
PACK VAGINAL MINOR WOMEN LF (CUSTOM PROCEDURE TRAY) ×3 IMPLANT
PAD DRESSING TELFA 3X8 NADH (GAUZE/BANDAGES/DRESSINGS) ×1 IMPLANT
PAD OB MATERNITY 4.3X12.25 (PERSONAL CARE ITEMS) ×3 IMPLANT
SET TUBING HYSTEROSCOPY 2 NDL (TUBING) ×2 IMPLANT
SYR CONTROL 10ML LL (SYRINGE) ×3 IMPLANT
TOWEL OR 17X24 6PK STRL BLUE (TOWEL DISPOSABLE) ×6 IMPLANT
TUBE HYSTEROSCOPY W Y-CONNECT (TUBING) ×2 IMPLANT
WATER STERILE IRR 1000ML POUR (IV SOLUTION) ×1 IMPLANT

## 2013-11-29 NOTE — Op Note (Addendum)
OPERATIVE NOTE  Holly Hopkins  DOB:    1971/02/09  MRN:    [id number]  CSN:    [id number]  Date of Surgery:  11/29/2013  Preoperative Diagnosis:  Menorrhagia  Submucosal fibroid  Obesity  Anemia  Postoperative Diagnosis:  Same  Procedure:  Hysteroscopy with resection of endometrial polyps Dilatation and curettage  Surgeon:  Gildardo Cranker, M.D.  Assistant:  None  Anesthetic:  General  Disposition:  The patient is a 43 y.o.-year-old female who presents with the above-mentioned diagnosis. She understands the indications for her surgical procedure. She accepts the risk of, but not limited to, anesthetic complications, bleeding, infections, and possible damage to the surrounding organs.  Findings:  On examination under anesthesia the uterus was upper limits normal size. No adnexal masses were appreciated. No parametrial disease was appreciated. The uterus sounded to 8 cm. The patient was noted to have a 2 cm submucosal fibroid.  Procedure:  The patient was taken to the operating room where a general anesthetic was given. The perineum and vagina were prepped with Betadine. The bladder was drained of urine. The patient was sterilely draped. Examination under anesthesia was performed. A paracervical block was placed using 10 cc of half percent Marcaine with epinephrine. An endocervical curettage was performed. The cervix was gently dilated. The diagnostic hysteroscope was inserted and the cavity was carefully inspected. Pictures were taken. Findings included: A 2 cm submucosal fibroid. The diagnostic hysteroscope was removed. The cervix was dilated further. The operative hysteroscope was inserted. The submucosal fibroid was resected using a single loop. The cavity was then curetted using a sharp curet. The cavity was felt to be clean at the end of our procedure. Hemostasis was adequate. A NovaSure instrument was placed inside the uterus. 3 times we performed a  test procedure but the instrument would not function. We felt we were not able to get an adequate seal on the cervix. The decision was made to discontinue any further attempts. All instruments were removed. The examination was repeated and the uterus was noted to be firm. Sponge, and needle counts were correct. The estimated blood loss for the procedure was 20 cc. The estimated fluid deficit loss 660 cc, but fluid was noted to be on the operating room floor and on the surgeon. The patient was awakened from her anesthetic without difficulty. She was returned to the supine position and and transported to the recovery room in stable condition. The endocervical curettings, endometrial resections, and endometrial curettings were sent to pathology.  Followup instructions:  The patient will return to see Dr. Raphael Gibney in 2 weeks. She was given a copy of the postoperative instructions for patients who've undergone hysteroscopy.  Discharge medications:  Motrin 800 mg every 8 hours as needed for mild to moderate pain. Percocet one tablet every 4 hours as needed for severe pain. Phenergan 12.5 mg every 6 hours as needed for nausea. Colace twice each day as needed for constipation.  Gildardo Cranker, M.D.

## 2013-11-29 NOTE — H&P (Signed)
The patient was interviewed and examined today.  The previously documented history and physical examination was reviewed. There are no changes. The operative procedure was reviewed. The risks and benefits were outlined again. The specific risks include, but are not limited to, anesthetic complications, bleeding, infections, and possible damage to the surrounding organs. The patient's questions were answered.  We are ready to proceed as outlined. The likelihood of the patient achieving the goals of this procedure is very likely.   Results for orders placed during the hospital encounter of 11/29/13 (from the past 24 hour(s))  PREGNANCY, URINE     Status: None   Collection Time    11/29/13 11:00 AM      Result Value Ref Range   Preg Test, Ur NEGATIVE  NEGATIVE  CBC     Status: Abnormal   Collection Time    11/29/13 11:05 AM      Result Value Ref Range   WBC 6.7  4.0 - 10.5 K/uL   RBC 5.03  3.87 - 5.11 MIL/uL   Hemoglobin 12.3  12.0 - 15.0 g/dL   HCT 38.2  36.0 - 46.0 %   MCV 75.9 (*) 78.0 - 100.0 fL   MCH 24.5 (*) 26.0 - 34.0 pg   MCHC 32.2  30.0 - 36.0 g/dL   RDW 16.5 (*) 11.5 - 15.5 %   Platelets 249  150 - 400 K/uL     Gildardo Cranker, M.D.

## 2013-11-29 NOTE — Anesthesia Preprocedure Evaluation (Addendum)
Anesthesia Evaluation  Patient identified by MRN, date of birth, ID band Patient awake    Reviewed: Allergy & Precautions, H&P , NPO status , Patient's Chart, lab work & pertinent test results  Airway Mallampati: II TM Distance: >3 FB Neck ROM: Full    Dental  (+) Teeth Intact   Pulmonary asthma ,    Pulmonary exam normal       Cardiovascular negative cardio ROS  Rhythm:Regular Rate:Normal     Neuro/Psych  Headaches, Seizures -, Well Controlled,  Anxiety    GI/Hepatic negative GI ROS, Neg liver ROS,   Endo/Other  negative endocrine ROS  Renal/GU negative Renal ROS  negative genitourinary   Musculoskeletal negative musculoskeletal ROS (+)   Abdominal   Peds  Hematology negative hematology ROS (+)   Anesthesia Other Findings   Reproductive/Obstetrics                          Anesthesia Physical Anesthesia Plan  ASA: II  Anesthesia Plan: General   Post-op Pain Management:    Induction: Intravenous  Airway Management Planned: LMA  Additional Equipment: None  Intra-op Plan:   Post-operative Plan: Extubation in OR  Informed Consent: I have reviewed the patients History and Physical, chart, labs and discussed the procedure including the risks, benefits and alternatives for the proposed anesthesia with the patient or authorized representative who has indicated his/her understanding and acceptance.   Dental advisory given  Plan Discussed with: CRNA and Surgeon  Anesthesia Plan Comments:        Anesthesia Quick Evaluation

## 2013-11-29 NOTE — Transfer of Care (Signed)
Immediate Anesthesia Transfer of Care Note  Patient: Holly Hopkins  Procedure(s) Performed: Procedure(s): DILATATION & CURETTAGE/HYSTEROSCOPY WITH  RESECTION (N/A)  Patient Location: PACU  Anesthesia Type:General  Level of Consciousness: awake, oriented, sedated, patient cooperative and confused  Airway & Oxygen Therapy: Patient Spontanous Breathing and Patient connected to nasal cannula oxygen  Post-op Assessment: Report given to PACU RN and Post -op Vital signs reviewed and stable  Post vital signs: Reviewed and stable  Complications: No apparent anesthesia complications

## 2013-11-29 NOTE — Discharge Instructions (Signed)
Hysteroscopy Hysteroscopy is a procedure used for looking inside the womb (uterus). It may be done for various reasons, including:  To evaluate abnormal bleeding, fibroid (benign, noncancerous) tumors, polyps, scar tissue (adhesions), and possibly cancer of the uterus.  To look for lumps (tumors) and other uterine growths.  To look for causes of why a woman cannot get pregnant (infertility), causes of recurrent loss of pregnancy (miscarriages), or a lost intrauterine device (IUD).  To perform a sterilization by blocking the fallopian tubes from inside the uterus. In this procedure, a thin, flexible tube with a tiny light and camera on the end of it (hysteroscope) is used to look inside the uterus. A hysteroscopy should be done right after a menstrual period to be sure you are not pregnant. LET [facility] CARE PROVIDER KNOW ABOUT:   Any allergies you have.  All medicines you are taking, including vitamins, herbs, eye drops, creams, and over-the-counter medicines.  Previous problems you or members of your family have had with the use of anesthetics.  Any blood disorders you have.  Previous surgeries you have had.  Medical conditions you have. RISKS AND COMPLICATIONS  Generally, this is a safe procedure. However, as with any procedure, complications can occur. Possible complications include:  Putting a hole in the uterus.  Excessive bleeding.  Infection.  Damage to the cervix.  Injury to other organs.  Allergic reaction to medicines.  Too much fluid used in the uterus for the procedure. BEFORE THE PROCEDURE   Ask your health care provider about changing or stopping any regular medicines.  Do not take aspirin or blood thinners for 1 week before the procedure, or as directed by your health care provider. These can cause bleeding.  If you smoke, do not smoke for 2 weeks before the procedure.  In some cases, a medicine is placed in the cervix the day before the procedure.  This medicine makes the cervix have a larger opening (dilate). This makes it easier for the instrument to be inserted into the uterus during the procedure.  Do not eat or drink anything for at least 8 hours before the surgery.  Arrange for someone to take you home after the procedure. PROCEDURE   You may be given a medicine to relax you (sedative). You may also be given one of the following:  A medicine that numbs the area around the cervix (local anesthetic).  A medicine that makes you sleep through the procedure (general anesthetic).  The hysteroscope is inserted through the vagina into the uterus. The camera on the hysteroscope sends a picture to a TV screen. This gives the surgeon a good view inside the uterus.  During the procedure, air or a liquid is put into the uterus, which allows the surgeon to see better.  Sometimes, tissue is gently scraped from inside the uterus. These tissue samples are sent to a lab for testing. AFTER THE PROCEDURE   If you had a general anesthetic, you may be groggy for a couple hours after the procedure.  If you had a local anesthetic, you will be able to go home as soon as you are stable and feel ready.  You may have some cramping. This normally lasts for a couple days.  You may have bleeding, which varies from light spotting for a few days to menstrual-like bleeding for 3 7 days. This is normal.  If your test results are not back during the visit, make an appointment with your health care provider to find out  the results. Document Released: 01/02/2001 Document Revised: 07/17/2013 Document Reviewed: 04/25/2013 ExitCare Patient Information 2014 Exit[employer], [place].    YOU MAY TAKE IBUPROFEN AFTER 6:30 PM TONIGHT  Post Anesthesia Home Care Instructions  Activity: Get plenty of rest for the remainder of the day. A responsible adult should stay with you for 24 hours following the procedure.  For the next 24 hours, DO NOT: -Drive a car -Conservation officer, nature -Drink alcoholic beverages -Take any medication unless instructed by your physician -Make any legal decisions or sign important papers.  Meals: Start with liquid foods such as gelatin or soup. Progress to regular foods as tolerated. Avoid greasy, spicy, heavy foods. If nausea and/or vomiting occur, drink only clear liquids until the nausea and/or vomiting subsides. Call your physician if vomiting continues.  Special Instructions/Symptoms: Your throat may feel dry or sore from the anesthesia or the breathing tube placed in your throat during surgery. If this causes discomfort, gargle with warm salt water. The discomfort should disappear within 24 hours.

## 2013-11-29 NOTE — Anesthesia Postprocedure Evaluation (Signed)
  Anesthesia Post-op Note  Patient: Holly Hopkins  Procedure(s) Performed: Procedure(s): DILATATION & CURETTAGE/HYSTEROSCOPY WITH  RESECTION (N/A)  Patient Location: PACU  Anesthesia Type:General  Level of Consciousness: awake, alert  and oriented  Airway and Oxygen Therapy: Patient Spontanous Breathing  Post-op Pain: mild  Post-op Assessment: Post-op Vital signs reviewed, Patient's Cardiovascular Status Stable, Respiratory Function Stable, Patent Airway, No signs of Nausea or vomiting and Pain level controlled  Post-op Vital Signs: Reviewed and stable  Complications: No apparent anesthesia complications

## 2013-12-02 ENCOUNTER — Encounter (HOSPITAL_COMMUNITY): Payer: Self-pay | Admitting: Obstetrics and Gynecology

## 2013-12-09 ENCOUNTER — Ambulatory Visit: Payer: BC Managed Care – PPO

## 2013-12-09 ENCOUNTER — Ambulatory Visit (INDEPENDENT_AMBULATORY_CARE_PROVIDER_SITE_OTHER): Payer: BC Managed Care – PPO | Admitting: Family Medicine

## 2013-12-09 VITALS — BP 124/74 | HR 76 | Temp 98.8°F | Resp 16 | Ht 64.0 in | Wt 246.4 lb

## 2013-12-09 DIAGNOSIS — R079 Chest pain, unspecified: Secondary | ICD-10-CM

## 2013-12-09 MED ORDER — CYCLOBENZAPRINE HCL 10 MG PO TABS: 10.0000 mg | ORAL_TABLET | Freq: Every day | ORAL | Status: DC

## 2013-12-09 NOTE — Progress Notes (Signed)
Subjective:    Patient ID: Holly Hopkins, female    DOB: 28-Nov-1970, 43 y.o.   MRN: [id number]  HPI Patient with one day history of substernal pain and mid back pain with deep breaths and movement of her arms, sharp and intermittent,  relieved with lying down.  She denies congestion but reports chills (feeling cold, especially her feet and hands, for the past 6 months).  Last night the pain woke her up around 2 am, lasted until about 4 am, at which time she was able to fall asleep.  She denies injury, illness, cough.  She works at a computer all day.  She reports an ER visit with hospital admission in early February for chest pain.  She had negative troponins, negative stress test, negative ECHO, negative doppler bilateral LE for DVT, negative CT angiography for PE.  She was told the chest pain was related to anemia.    She has followed up with her OB/GYN, Dr. Raphael Gibney at [facility], and had a procedure related to her 10 year history of heavy menstrual bleeding.  Dr. Raphael Gibney is managing her anemia and her next follow up is 12/13/13.    Review of Systems  Constitutional: Positive for chills (reports hands and feet are cold ), appetite change (reports "I have to force myself to eat"), fatigue and unexpected weight change (weight gain from 234-246# in past 30 days). Negative for fever.  HENT: Negative.  Negative for congestion.   Eyes: Negative.   Respiratory: Positive for shortness of breath (increasing dyspnea on exertion over past 6 months). Negative for cough, chest tightness and wheezing.   Cardiovascular: Positive for chest pain (see HPI) and leg swelling (onset 6 months ago, increases with menstural cycle).  Gastrointestinal: Negative.        10 year history of GERD, off medication for past 3 years, denies symptoms  Genitourinary: Positive for menstrual problem (heavy menses).  Musculoskeletal: Positive for back pain (midline thoracic). Negative for neck pain and neck stiffness.       Objective:   Physical Exam  Constitutional: She is oriented to person, place, and time. She appears well-developed and well-nourished. No distress.  HENT:  Head: Normocephalic and atraumatic.  Eyes: Conjunctivae and EOM are normal. Pupils are equal, round, and reactive to light. No scleral icterus.  Neck: Normal range of motion. Neck supple.  Cardiovascular: Normal rate, regular rhythm and normal heart sounds.  Exam reveals no gallop and no friction rub.   No murmur heard. Pulmonary/Chest: Effort normal and breath sounds normal. No accessory muscle usage. No respiratory distress. She has no wheezes. She has no rhonchi. She has no rales.  No cough with deep breathing. Deep breaths produces substernal and midline thoracic pain.  Musculoskeletal:  ROM bilateral arms reproduces pain. Palpation to sternum and mid back reproduces pain.   Lymphadenopathy:       Head (right side): No submandibular, no tonsillar, no preauricular, no posterior auricular and no occipital adenopathy present.       Head (left side): No submandibular, no tonsillar, no preauricular, no posterior auricular and no occipital adenopathy present.  Neurological: She is alert and oriented to person, place, and time.  Skin: Skin is warm and dry.  Psychiatric: She has a normal mood and affect. Her behavior is normal. Judgment and thought content normal.   [facility] reading (PRIMARY) by  Dr. Joseph Art.  CXR no pulmonary abnormalities noted, thoracic scoliosis seen.  EKG reviewed with Dr. Joseph Art, no ischemic changes.  Assessment & Plan:   1. Chest pain Likely musculoskeletal in origin related to thoracic scoliosis and work.  Will treat symptomatically and refer to PT. - DG Chest 2 View; Future - EKG 12-Lead - cyclobenzaprine (FLEXERIL) 10 MG tablet; Take 1 tablet (10 mg total) by mouth at bedtime.  Dispense: 30 tablet; Refill: 0 - Ambulatory referral to Physical Therapy

## 2013-12-09 NOTE — Patient Instructions (Signed)
Follow-up with Dr. Raphael Gibney on Friday as planned.  We've referred you to physical therapy.  You have some scoliosis in your upper back (the thoracic region), and given your work, you appear to be having musculoskeletal strain and pain. The muscle relaxer can cause sleepiness, so we suggest that you take it only at bedtime.

## 2013-12-09 NOTE — Progress Notes (Signed)
I have examined this patient along with the student and agree.  

## 2013-12-12 NOTE — Progress Notes (Signed)
Scheduled appointment with Dr Leward Quan on 3/19 @ 10:15 am to establish care.

## 2013-12-25 ENCOUNTER — Ambulatory Visit: Payer: BC Managed Care – PPO | Attending: Physician Assistant | Admitting: Physical Therapy

## 2013-12-26 ENCOUNTER — Ambulatory Visit (INDEPENDENT_AMBULATORY_CARE_PROVIDER_SITE_OTHER): Payer: BC Managed Care – PPO | Admitting: Family Medicine

## 2013-12-26 ENCOUNTER — Encounter: Payer: Self-pay | Admitting: Family Medicine

## 2013-12-26 VITALS — BP 118/76 | HR 80 | Temp 99.1°F | Resp 16 | Ht 64.0 in | Wt 243.0 lb

## 2013-12-26 DIAGNOSIS — E669 Obesity, unspecified: Secondary | ICD-10-CM | POA: Insufficient documentation

## 2013-12-26 DIAGNOSIS — E119 Type 2 diabetes mellitus without complications: Secondary | ICD-10-CM

## 2013-12-26 DIAGNOSIS — Z23 Encounter for immunization: Secondary | ICD-10-CM

## 2013-12-26 HISTORY — DX: Type 2 diabetes mellitus without complications: E11.9

## 2013-12-26 MED ORDER — METFORMIN HCL 500 MG PO TABS: 500.0000 mg | ORAL_TABLET | Freq: Two times a day (BID) | ORAL | Status: DC

## 2013-12-26 MED ORDER — GLUCOSE BLOOD VI STRP: ORAL_STRIP | Status: DC

## 2013-12-26 NOTE — Patient Instructions (Addendum)
Diabetes Meal Planning Guide The diabetes meal planning guide is a tool to help you plan your meals and snacks. It is important for people with diabetes to manage their blood glucose (sugar) levels. Choosing the right foods and the right amounts throughout your day will help control your blood glucose. Eating right can even help you improve your blood pressure and reach or maintain a healthy weight. CARBOHYDRATE COUNTING MADE EASY When you eat carbohydrates, they turn to sugar. This raises your blood glucose level. Counting carbohydrates can help you control this level so you feel better. When you plan your meals by counting carbohydrates, you can have more flexibility in what you eat and balance your medicine with your food intake. Carbohydrate counting simply means adding up the total amount of carbohydrate grams in your meals and snacks. Try to eat about the same amount at each meal. Foods with carbohydrates are listed below. Each portion below is 1 carbohydrate serving or 15 grams of carbohydrates. Ask your dietician how many grams of carbohydrates you should eat at each meal or snack. Grains and Starches  1 slice bread.   English muffin or hotdog/hamburger bun.   cup cold cereal (unsweetened).   cup cooked pasta or rice.   cup starchy vegetables (corn, potatoes, peas, beans, winter squash).  1 tortilla (6 inches).   bagel.  1 waffle or pancake (size of a CD).   cup cooked cereal.  4 to 6 small crackers. *Whole grain is recommended. Fruit  1 cup fresh unsweetened berries, melon, papaya, pineapple.  1 small fresh fruit.   banana or mango.   cup fruit juice (4 oz unsweetened).   cup canned fruit in natural juice or water.  2 tbs dried fruit.  12 to 15 grapes or cherries. Milk and Yogurt  1 cup fat-free or 1% milk.  1 cup soy milk.  6 oz light yogurt with sugar-free sweetener.  6 oz low-fat soy yogurt.  6 oz plain yogurt. Vegetables  1 cup raw or  cup  cooked is counted as 0 carbohydrates or a "free" food.  If you eat 3 or more servings at 1 meal, count them as 1 carbohydrate serving. Other Carbohydrates   oz chips or pretzels.   cup ice cream or frozen yogurt.   cup sherbet or sorbet.  2 inch square cake, no frosting.  1 tbs honey, sugar, jam, jelly, or syrup.  2 small cookies.  3 squares of graham crackers.  3 cups popcorn.  6 crackers.  1 cup broth-based soup.  Count 1 cup casserole or other mixed foods as 2 carbohydrate servings.  Foods with less than 20 calories in a serving may be counted as 0 carbohydrates or a "free" food. You may want to purchase a book or computer software that lists the carbohydrate gram counts of different foods. In addition, the nutrition facts panel on the labels of the foods you eat are a good source of this information. The label will tell you how big the serving size is and the total number of carbohydrate grams you will be eating per serving. Divide this number by 15 to obtain the number of carbohydrate servings in a portion. Remember, 1 carbohydrate serving equals 15 grams of carbohydrate. SERVING SIZES Measuring foods and serving sizes helps you make sure you are getting the right amount of food. The list below tells how big or small some common serving sizes are.  1 oz.........4 stacked dice.  3 oz.........Deck of cards.  1 tsp........Tip   of little finger.  1 tbs......Marland KitchenMarland KitchenThumb.  2 tbs.......Marland KitchenGolf ball.   cup......Marland KitchenHalf of a fist.  1 cup.......Marland KitchenA fist. SAMPLE DIABETES MEAL PLAN Below is a sample meal plan that includes foods from the grain and starches, dairy, vegetable, fruit, and meat groups. A dietician can individualize a meal plan to fit your calorie needs and tell you the number of servings needed from each food group. However, controlling the total amount of carbohydrates in your meal or snack is more important than making sure you include all of the food groups at every  meal. You may interchange carbohydrate containing foods (dairy, starches, and fruits). The meal plan below is an example of a 2000 calorie diet using carbohydrate counting. This meal plan has 17 carbohydrate servings. Breakfast  1 cup oatmeal (2 carb servings).   cup light yogurt (1 carb serving).  1 cup blueberries (1 carb serving).   cup almonds. Snack  1 large apple (2 carb servings).  1 low-fat string cheese stick. Lunch  Chicken breast salad.  1 cup spinach.   cup chopped tomatoes.  2 oz chicken breast, sliced.  2 tbs low-fat [place] dressing.  12 whole-wheat crackers (2 carb servings).  12 to 15 grapes (1 carb serving).  1 cup low-fat milk (1 carb serving). Snack  1 cup carrots.   cup hummus (1 carb serving). Dinner  3 oz broiled salmon.  1 cup [place] rice (3 carb servings). Snack  1  cups steamed broccoli (1 carb serving) drizzled with 1 tsp olive oil and lemon juice.  1 cup light pudding (2 carb servings). DIABETES MEAL PLANNING WORKSHEET Your dietician can use this worksheet to help you decide how many servings of foods and what types of foods are right for you.  BREAKFAST Food Group and Servings / Carb Servings Grain/Starches __________________________________ Dairy __________________________________________ Vegetable ______________________________________ Fruit ___________________________________________ Meat __________________________________________ Fat ____________________________________________ LUNCH Food Group and Servings / Carb Servings Grain/Starches ___________________________________ Dairy ___________________________________________ Fruit ____________________________________________ Meat ___________________________________________ Fat _____________________________________________ Wonda [employer] Food Group and Servings / Carb Servings Grain/Starches ___________________________________ Dairy  ___________________________________________ Fruit ____________________________________________ Meat ___________________________________________ Fat _____________________________________________ SNACKS Food Group and Servings / Carb Servings Grain/Starches ___________________________________ Dairy ___________________________________________ Vegetable _______________________________________ Fruit ____________________________________________ Meat ___________________________________________ Fat _____________________________________________ DAILY TOTALS Starches _________________________ Vegetable ________________________ Fruit ____________________________ Dairy ____________________________ Meat ____________________________ Fat ______________________________ Document Released: 06/23/2005 Document Revised: 12/19/2011 Document Reviewed: 05/04/2009 Exit[employer] Patient Information 2014 [place] [employer].    Blood Glucose Monitoring, Adult Monitoring your blood glucose (also know as blood sugar) helps you to manage your diabetes. It also helps you and your health care provider monitor your diabetes and determine how well your treatment plan is working. WHY SHOULD YOU MONITOR YOUR BLOOD GLUCOSE?  It can help you understand how food, exercise, and medicine affect your blood glucose.  It allows you to know what your blood glucose is at any given moment. You can quickly tell if you are having low blood glucose (hypoglycemia) or high blood glucose (hyperglycemia).  It can help you and your health care provider know how to adjust your medicines.  It can help you understand how to manage an illness or adjust medicine for exercise. WHEN SHOULD YOU TEST? Your health care provider will help you decide how often you should check your blood glucose. This may depend on the type of diabetes you have, your diabetes control, or the types of medicines you are taking. Be sure to write down all of your blood  glucose readings so that this information can be reviewed with your health care  provider. See below for examples of testing times that your health care provider may suggest. Type 1 Diabetes  Test 4 times a day if you are in good control, using an insulin pump, or perform multiple daily injections.  If your diabetes is not well-controlled or if you are sick, you may need to monitor more often.  It is a good idea to also monitor:  Before and after exercise.  Between meals and 2 hours after a meal.  Occasionally between 2:00 to 3:00 am. Type 2 Diabetes  It can vary with each person, but generally, if you are on insulin, test 4 times a day.  If you take medicines by mouth (orally), test 2 times a day.  If you are on a controlled diet, test once a day.  If your diabetes is not well controlled or if you are sick, you may need to monitor more often. HOW TO MONITOR YOUR BLOOD GLUCOSE Supplies Needed  Blood glucose meter.  Test strips for your meter. Each meter has its own strips. You must use the strips that go with your own meter.  A pricking needle (lancet).  A device that holds the lancet (lancing device).  A journal or log book to write down your results. Procedure  Wash your hands with soap and water. Alcohol is not preferred.  Prick the side of your finger (not the tip) with the lancet.  Gently milk the finger until a small drop of blood appears.  Follow the instructions that come with your meter for inserting the test strip, applying blood to the strip, and using your blood glucose meter. Other Areas to Get Blood for Testing Some meters allow you to use other areas of your body (other than your finger) to test your blood. These areas are called alternative sites. The most common alternative sites are:  The forearm.  The thigh.  The back area of the lower leg.  The palm of the hand. The blood flow in these areas is slower. Therefore, the blood glucose values you  get may be delayed, and the numbers are different from what you would get from your fingers. Do not use alternative sites if you think you are having hypoglycemia. Your reading will not be accurate. Always use a finger if you are having hypoglycemia. Also, if you cannot feel your lows (hypoglycemia unawareness), always use your fingers for your blood glucose checks. ADDITIONAL TIPS FOR GLUCOSE MONITORING  Do not reuse lancets.  Always carry your supplies with you.  All blood glucose meters have a 24-hour "hotline" number to call if you have questions or need help.  Adjust (calibrate) your blood glucose meter with a control solution after finishing a few boxes of strips. BLOOD GLUCOSE RECORD KEEPING It is a good idea to keep a daily record or log of your blood glucose readings. Most glucose meters, if not all, keep your glucose records stored in the meter. Some meters come with the ability to download your records to your home computer. Keeping a record of your blood glucose readings is especially helpful if you are wanting to look for patterns. Make notes to go along with the blood glucose readings because you might forget what happened at that exact time. Keeping good records helps you and your health care provider to work together to achieve good diabetes management.  Document Released: 09/29/2003 Document Revised: 05/29/2013 Document Reviewed: 02/18/2013 [facility] Patient Information 2014 [place].    Diabetes and Exercise Exercising regularly is important. It  is not just about losing weight. It has many health benefits, such as:  Improving your overall fitness, flexibility, and endurance.  Increasing your bone density.  Helping with weight control.  Decreasing your body fat.  Increasing your muscle strength.  Reducing stress and tension.  Improving your overall health. People with diabetes who exercise gain additional benefits because exercise:  Reduces  appetite.  Improves the body's use of blood sugar (glucose).  Helps lower or control blood glucose.  Decreases blood pressure.  Helps control blood lipids (such as cholesterol and triglycerides).  Improves the body's use of the hormone insulin by:  Increasing the body's insulin sensitivity.  Reducing the body's insulin needs.  Decreases the risk for heart disease because exercising:  Lowers cholesterol and triglycerides levels.  Increases the levels of good cholesterol (such as high-density lipoproteins [HDL]) in the body.  Lowers blood glucose levels. YOUR ACTIVITY PLAN  Choose an activity that you enjoy and set realistic goals. Your health care provider or diabetes educator can help you make an activity plan that works for you. You can break activities into 2 or 3 sessions throughout the day. Doing so is as good as one long session. Exercise ideas include:  Taking the dog for a walk.  Taking the stairs instead of the elevator.  Dancing to your favorite song.  Doing your favorite exercise with a friend. RECOMMENDATIONS FOR EXERCISING WITH TYPE 1 OR TYPE 2 DIABETES   Check your blood glucose before exercising. If blood glucose levels are greater than 240 mg/dL, check for urine ketones. Do not exercise if ketones are present.  Avoid injecting insulin into areas of the body that are going to be exercised. For example, avoid injecting insulin into:  The arms when playing tennis.  The legs when jogging.  Keep a record of:  Food intake before and after you exercise.  Expected peak times of insulin action.  Blood glucose levels before and after you exercise.  The type and amount of exercise you have done.  Review your records with your health care provider. Your health care provider will help you to develop guidelines for adjusting food intake and insulin amounts before and after exercising.  If you take insulin or oral hypoglycemic agents, watch for signs and  symptoms of hypoglycemia. They include:  Dizziness.  Shaking.  Sweating.  Chills.  Confusion.  Drink plenty of water while you exercise to prevent dehydration or heat stroke. Body water is lost during exercise and must be replaced.  Talk to your health care provider before starting an exercise program to make sure it is safe for you. Remember, almost any type of activity is better than none. Document Released: 12/17/2003 Document Revised: 05/29/2013 Document Reviewed: 03/05/2013 [employer] [facility] [employer] Patient Information 2014 [place].     METFORMIN is the medication that you will take for blood sugar control and to help with weight loss. Take 1 tablet after your largest meal (Breakfast) every day. Check your morning blood sugar most days of the week.

## 2013-12-26 NOTE — Progress Notes (Signed)
S;  This 43 y.o. AA female has Type II DM, recently diagnosed w/ A1c= 6.6% (10/18/13). She has hx of gestational DM. Family Hx is significant for heart disease, HTN and DM. Pt has recently embarked upon a fitness program w/ family member. She denies polyuria, polydipsia or polyphagia.  Patient Active Problem List   Diagnosis Date Noted  . Obesity, unspecified 12/26/2013  . Type II or unspecified type diabetes mellitus without mention of complication, not stated as uncontrolled 12/26/2013  . Chest pain 10/17/2013  . Pedal edema 10/17/2013  . Menorrhagia 10/17/2013  . Microcytic anemia 10/17/2013  . Seizure   . 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 PIH (pregnancy induced hypertension) 01/25/2012  . Fibroids 01/25/2012  . Breast mass, left 01/25/2012  . STD (sexually transmitted disease) 01/25/2012  . Insulin resistance 01/25/2012  . Migraine   . ASTHMA 01/17/2007  . BIPOLAR DISORDER 12/07/2006  . Allergic Rhinitis, Cause Unspecified 12/07/2006  . GASTROESOPHAGEAL REFLUX, NO ESOPHAGITIS 12/07/2006   PMHx, surg Hx, Soc and Fam Hx reviewed.  MEDICATIONS reconciled.  ROS: As per HPI; Negative for appetite change; diaphoresis, fatigue, vision disturbances, CP or tightness, palpitations, SOB or DOE, cough, abd pain or GI upset, myalgias, skin or hair changes, HA, dizziness, numbness, weakness, tremor or syncope. Pt has diagnosis of bipolar disorder and is stable.  O: Filed Vitals:   12/26/13 1028  BP: 118/76  Pulse: 80  Temp: 99.1 F (37.3 C)  Resp: 16   GEN: in NAD; WN,WD. HENT: [place]/AT; EOMI w/ clear conj/sclerae. Otherwise unremarkable. COR: RRR. LUNGS: Unlabored resp. SKIN: W&D; intact w/o erythema, rashes or jaundice. MS: MAEs; no c/c/e. NEURO: A&O x 3; CNs intact. Nonfocal. PSYCH: Pleasant and conversant; speech slightly pressured. Attentive w/ good eye contact. Thought content normal. Judgement sound.  A/P: Obesity, unspecified - Plan: Ambulatory referral to  diabetic education  Type II or unspecified type diabetes mellitus without mention of complication, not stated as uncontrolled - Plan: Start Metformin 500 mg 1 tablet every morning w/ breakfast (which pt states is her largest meal). Ambulatory referral to diabetic education, For home use only DME Glucometer  Need for prophylactic vaccination with combined diphtheria-tetanus-pertussis (DTP) vaccine - Plan: Tdap vaccine greater than or equal to 7yo IM  Meds ordered this encounter  Medications  . metFORMIN (GLUCOPHAGE) 500 MG tablet    Sig: Take 1 tablet (500 mg total) by mouth 2 (two) times daily with a meal.    Dispense:  180 tablet    Refill:  3

## 2014-01-04 ENCOUNTER — Ambulatory Visit (INDEPENDENT_AMBULATORY_CARE_PROVIDER_SITE_OTHER): Payer: BC Managed Care – PPO | Admitting: Emergency Medicine

## 2014-01-04 VITALS — BP 118/80 | HR 77 | Temp 98.7°F | Resp 18 | Ht 64.0 in | Wt 243.0 lb

## 2014-01-04 DIAGNOSIS — J029 Acute pharyngitis, unspecified: Secondary | ICD-10-CM

## 2014-01-04 LAB — POCT RAPID STREP A (OFFICE): Rapid Strep A Screen: NEGATIVE

## 2014-01-04 MED ORDER — AMOXICILLIN 875 MG PO TABS: 875.0000 mg | ORAL_TABLET | Freq: Two times a day (BID) | ORAL | Status: DC

## 2014-01-04 MED ORDER — FIRST-DUKES MOUTHWASH MT SUSP: OROMUCOSAL | Status: DC

## 2014-01-04 NOTE — Patient Instructions (Signed)
Sore Throat A sore throat is pain, burning, irritation, or scratchiness of the throat. There is often pain or tenderness when swallowing or talking. A sore throat may be accompanied by other symptoms, such as coughing, sneezing, fever, and swollen neck glands. A sore throat is often the first sign of another sickness, such as a cold, flu, strep throat, or mononucleosis (commonly known as mono). Most sore throats go away without medical treatment. CAUSES  The most common causes of a sore throat include:  A viral infection, such as a cold, flu, or mono.  A bacterial infection, such as strep throat, tonsillitis, or whooping cough.  Seasonal allergies.  Dryness in the air.  Irritants, such as smoke or pollution.  Gastroesophageal reflux disease (GERD). HOME CARE INSTRUCTIONS   Only take over-the-counter medicines as directed by your caregiver.  Drink enough fluids to keep your urine clear or pale yellow.  Rest as needed.  Try using throat sprays, lozenges, or sucking on hard candy to ease any pain (if older than 4 years or as directed).  Sip warm liquids, such as broth, herbal tea, or warm water with honey to relieve pain temporarily. You may also eat or drink cold or frozen liquids such as frozen ice pops.  Gargle with salt water (mix 1 tsp salt with 8 oz of water).  Do not smoke and avoid secondhand smoke.  Put a cool-mist humidifier in your bedroom at night to moisten the air. You can also turn on a hot shower and sit in the bathroom with the door closed for 5 10 minutes. SEEK IMMEDIATE MEDICAL CARE IF:  You have difficulty breathing.  You are unable to swallow fluids, soft foods, or your saliva.  You have increased swelling in the throat.  Your sore throat does not get better in 7 days.  You have nausea and vomiting.  You have a fever or persistent symptoms for more than 2 3 days.  You have a fever and your symptoms suddenly get worse. MAKE SURE YOU:   Understand  these instructions.  Will watch your condition.  Will get help right away if you are not doing well or get worse. Document Released: 11/03/2004 Document Revised: 09/12/2012 Document Reviewed: 06/03/2012 [employer] Patient Information 2014 [employer].  

## 2014-01-04 NOTE — Progress Notes (Signed)
   Subjective:    Patient ID: Holly Hopkins, female    DOB: 20-Nov-1970, 43 y.o.   MRN: [id number]  HPI  43 YO female patient comes to the clinic today with complaints of a sore throat started 2 days ago. She now has laryngitis and a cough that produces a greenish yellow sputum. She complains that her ears are also achy. Her nasal drainage has been yellow and constant. 43 YO has been ill in her home. Patient works from home. 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    Last evening her throat hurt so bad she took an old prescription for percocet.   Review of Systems     Objective:   Physical Exam is alert and cooperative. Her neck is supple. The TMs are clear to nose is congested. Her throat is red she has had a tonsillectomy. There are no nodes present. Her chest is clear to both auscultation and percussion. Results for orders placed in visit on 01/04/14  POCT RAPID STREP A (OFFICE)      Result Value Ref Range   Rapid Strep A Screen Negative  Negative         Assessment & Plan:  We'll treat with amoxicillin as well as [facility]'s mouthwash. She has had a greenish nasal drainage. I suspect the underlying illness is viral.

## 2014-01-06 LAB — CULTURE, GROUP A STREP: Organism ID, Bacteria: NORMAL

## 2014-01-20 ENCOUNTER — Other Ambulatory Visit: Payer: Self-pay | Admitting: Emergency Medicine

## 2014-01-21 NOTE — Telephone Encounter (Signed)
I authorized Ventolin MDI. Will follow-up on this when I see her in June.

## 2014-01-21 NOTE — Telephone Encounter (Signed)
Dr Leward Quan, it looks like pt est w/you as PCP. Do we need to change this in EPIC? Do you want to Rx the albuterol for pt, it looks like Dr Ouida Sills Rxd it 12/2012, but you  Didn't discuss at [place].

## 2014-01-22 ENCOUNTER — Encounter: Payer: BC Managed Care – PPO | Attending: Family Medicine | Admitting: Dietician

## 2014-01-22 ENCOUNTER — Encounter: Payer: Self-pay | Admitting: Dietician

## 2014-01-22 VITALS — Ht 64.0 in | Wt 248.0 lb

## 2014-01-22 DIAGNOSIS — R7309 Other abnormal glucose: Secondary | ICD-10-CM | POA: Insufficient documentation

## 2014-01-22 DIAGNOSIS — R7303 Prediabetes: Secondary | ICD-10-CM

## 2014-01-22 DIAGNOSIS — Z713 Dietary counseling and surveillance: Secondary | ICD-10-CM | POA: Insufficient documentation

## 2014-01-22 NOTE — Progress Notes (Signed)
Medical Nutrition Therapy:  Appt start time: 1100 end time:  1215.   Assessment:  Primary concerns today: Blood sugar, prediabetes, wants to know how to eat for weight loss and blood sugar management. Patient has has history of gestational diabetes. She lives at home with her two boys, 45 year old and 45 year old. She makes meals for the family and they eat breakfast and dinner as a family. Patient has a history of back and forth weight loss and weight gain (lowest weight mentioned, 198 lbs), periods of activity and inactivity, and patient describes herself as an emotional eater. Patient was eager to learn how to better manage her blood sugar through diet. Patient shared at the end of session that she had seen a nutritionist about 15 years ago and had a bad experience.   Preferred Learning Style:   No preference indicated   Learning Readiness:   Ready  Change in progress   MEDICATIONS: See chart   DIETARY INTAKE:  Usual eating pattern includes 3 meals and 1-2 snacks per day.  24-hr recall:  B (6:30-7:00 AM): Cheerios, Oatmeal, Raisin Bran, Bacon and Eggs, [employer] with potatoes and peppers and onions and eggs and bacon and sausage and cheese. Water  Snk ( AM): cup of yogurt with probiotic or pistaccios or Belvita,  L ( PM): McDouble from [employer] and sweet tea large, Salad from home, tomatoes, cucumbers, avocado, onions, mushrooms, water, Mimi's salad, iHop when emotional, steak and cheese omlet with extra cheese tomatoes etc, eats half, takes other half home to her boys, cranberry juice, or sparkler like a sprite with syrup Snk ( PM): none D (6-7:00 PM): Eats with boys, baked and fried chicken, fried fish, potatoes, rice, green beans, spinach, greens, corn, okra, (used to use bacon grease) sweet potato patties with cinnimon and honey, asian pork soup-bok choy, onions, cube steak, [place] and gravy Snk ( PM): none - used to Beverages: water, sweet tea, soda, juice, frozen  juice - grape, cranberry, passion, orange mango juice -  10 oz once a day, before menstrual cycle, 4-5 glasses  Checking blood sugar per physician, right now checking once a week 2 hours after eat dinner, highest 185, sometimes 96. Patient noticed that fried fish, slaw, and a can of pinto beans.  Usual physical activity: Nothing right now  Estimated energy needs: 1800 calories 60-75 g carbohydrates  Progress Towards Goal(s):  No progress.   Nutritional Diagnosis:  NB-1.1 Food and nutrition-related knowledge deficit As related to lack of understanding of what foods affect blood sugar and appropriate portion sizes.  As evidenced by patient's verbal acknowledgment of her misinformation and A1C of 6.4%.    Intervention:  Nutrition Education and Counseling.  We reviewed how food and exercise affect blood sugar, what foods do and don't raise blood sugar, carbohydrate counting, and intuitive eating principles. Patient was surprised to learn what foods could affect blood sugar and felt that she could see in her diet where she was contributing to the increase in her blood sugar.  Plan:  Aim for 2 Carb Choices per meal (30 grams) +/- 1 either way  Aim for 0-1 Carbs per snack if hungry Consider including protein with meals and snacks Consider reading food labels for Total Carbohydrate and Fat Grams of foods Decrease juice and sugar-sweetened beverage intake and replace with water  Teaching Method Utilized:  Visual Auditory Hands on  Handouts given during visit include:  Yellow Card  Living with Diabetes Booklet  Carb counting sheet  MyPlate  Barriers to learning/adherence to lifestyle change: None  Demonstrated degree of understanding via:  Teach Back   Monitoring/Evaluation:  Dietary intake, exercise, carbohydrate counting, and body weight in 1 month(s).

## 2014-01-22 NOTE — Patient Instructions (Signed)
Plan:  Aim for 2 Carb Choices per meal (30 grams) +/- 1 either way  Aim for 0-1 Carbs per snack if hungry Consider including protein with meals and snacks Consider reading food labels for Total Carbohydrate and Fat Grams of foods Decrease juice and sugar-sweetened beverage intake and replace with water

## 2014-02-26 ENCOUNTER — Ambulatory Visit: Payer: BC Managed Care – PPO | Admitting: Dietician

## 2014-03-05 ENCOUNTER — Encounter: Payer: BC Managed Care – PPO | Attending: Family Medicine | Admitting: Dietician

## 2014-03-05 ENCOUNTER — Encounter: Payer: Self-pay | Admitting: Dietician

## 2014-03-05 DIAGNOSIS — Z713 Dietary counseling and surveillance: Secondary | ICD-10-CM | POA: Insufficient documentation

## 2014-03-05 DIAGNOSIS — R7309 Other abnormal glucose: Secondary | ICD-10-CM | POA: Insufficient documentation

## 2014-03-05 NOTE — Patient Instructions (Addendum)
Plan  Increase activity with chair exercises as able - also www.sparkpeople.com Continue with carbohydrate counting Continue to honor hunger and fullness cues Off n' Running for Shoes

## 2014-03-05 NOTE — Progress Notes (Signed)
  Medical Nutrition Therapy:  Appt start time: 9798 end time:  1645.   Assessment:  Primary concerns today: Blood sugar, prediabetes, weight management. Patient has lost 3 lbs since last encounter on April 15th. Patient reports that she feels that she is not as stressed, not as angry, that she is emotionally eating less. Feels hungry now in the morning where she used to feel full. Experiencing repetition at dinner and is bored - going to try [employer] to switch it up. Patient noted improvements in menstrual cycle symptoms.  Preferred Learning Style:   No preference indicated   Learning Readiness:   Ready  Change in progress   MEDICATIONS: See chart   DIETARY INTAKE:  Usual eating pattern includes 3 meals and 1-2 snacks per day.  24-hr recall:  B (6:30-7:00 AM): Boiled egg and cereal or oatmeal. Water  Snk (8:45-9:00 AM):  Belvita,  L ( 12:00 PM): Chicken salad and bottle water, meat wrapped in lettuce Snk ( PM): Yogurt 4- 6oz, water D (6-7:00 PM): Eats with boys, started baking chicken with bread crumbs instead, baked breaded fish, salad,.Beef twice a week (more difficult to digest after chole). Is going to try [employer] Snk ( PM): none - used to Beverages: water  Checking blood sugar per physician Bed - 130-185 (185 was after fried food) Morning - 85-100  Usual physical activity: Foot problems. Housework, stretches, son is having her do stretches and squats while watching tv.  Estimated energy needs: 1800 calories 60-75 g carbohydrates  Progress Towards Goal(s):  In progress.   Nutritional Diagnosis:  NB-1.1 Food and nutrition-related knowledge deficit As related to lack of understanding of what foods affect blood sugar and appropriate portion sizes.  As evidenced by patient's verbal acknowledgment of her misinformation and A1C of 6.4%.    Intervention:  Nutrition Education and Counseling.  We reviewed the importance of honoring hunger and fullness cues. We reviewed  importance of activity for weight and blood sugar management.  Plan:  Increase activity with chair exercises as able - also www.sparkpeople.com Continue with carbohydrate counting Continue to honor hunger and fullness cues Off n' Running for Shoes Aim for 2 Carb Choices per meal (30 grams) +/- 1 either way  Aim for 0-1 Carbs per snack if hungry   Teaching Method Utilized:  Visual Auditory Hands on  Handouts given during visit include:  Chair Exercises  Barriers to learning/adherence to lifestyle change: None  Demonstrated degree of understanding via:  Teach Back   Monitoring/Evaluation:  Dietary intake, exercise, carbohydrate counting, and body weight prn.

## 2014-04-02 ENCOUNTER — Ambulatory Visit: Payer: BC Managed Care – PPO | Admitting: Family Medicine

## 2014-04-10 ENCOUNTER — Encounter: Payer: Self-pay | Admitting: Family Medicine

## 2014-04-10 ENCOUNTER — Ambulatory Visit (INDEPENDENT_AMBULATORY_CARE_PROVIDER_SITE_OTHER): Payer: BC Managed Care – PPO | Admitting: Family Medicine

## 2014-04-10 VITALS — BP 120/80 | HR 75 | Temp 98.0°F | Resp 16 | Ht 63.5 in | Wt 238.0 lb

## 2014-04-10 DIAGNOSIS — Z8639 Personal history of other endocrine, nutritional and metabolic disease: Secondary | ICD-10-CM

## 2014-04-10 DIAGNOSIS — E119 Type 2 diabetes mellitus without complications: Secondary | ICD-10-CM

## 2014-04-10 DIAGNOSIS — E669 Obesity, unspecified: Secondary | ICD-10-CM

## 2014-04-10 LAB — COMPREHENSIVE METABOLIC PANEL
ALT: 12 U/L (ref 0–35)
AST: 14 U/L (ref 0–37)
Albumin: 4 g/dL (ref 3.5–5.2)
Alkaline Phosphatase: 67 U/L (ref 39–117)
BILIRUBIN TOTAL: 0.4 mg/dL (ref 0.2–1.2)
BUN: 10 mg/dL (ref 6–23)
CALCIUM: 9.3 mg/dL (ref 8.4–10.5)
CHLORIDE: 107 meq/L (ref 96–112)
CO2: 26 meq/L (ref 19–32)
CREATININE: 0.78 mg/dL (ref 0.50–1.10)
GLUCOSE: 95 mg/dL (ref 70–99)
Potassium: 4.5 mEq/L (ref 3.5–5.3)
Sodium: 140 mEq/L (ref 135–145)
TOTAL PROTEIN: 6.9 g/dL (ref 6.0–8.3)

## 2014-04-10 LAB — POCT GLYCOSYLATED HEMOGLOBIN (HGB A1C): HEMOGLOBIN A1C: 6

## 2014-04-10 NOTE — Patient Instructions (Addendum)
Diabetes is well controlled; continue your current self-management plan as well as working on weight loss.

## 2014-04-11 ENCOUNTER — Encounter: Payer: Self-pay | Admitting: Family Medicine

## 2014-04-11 LAB — THYROID PANEL WITH TSH
Free Thyroxine Index: 3.6 (ref 1.0–3.9)
T3 UPTAKE: 28.2 % (ref 22.5–37.0)
T4 TOTAL: 12.9 ug/dL — AB (ref 5.0–12.5)
TSH: 0.69 u[IU]/mL (ref 0.350–4.500)

## 2014-04-11 LAB — VITAMIN D 25 HYDROXY (VIT D DEFICIENCY, FRACTURES): Vit D, 25-Hydroxy: 27 ng/mL — ABNORMAL LOW (ref 30–89)

## 2014-04-11 NOTE — Progress Notes (Signed)
Quick Note:  Please advise pt regarding following labs... Vitamin D level is below normal; if you are taking a supplement, increase it to 2000 units per day. 10-15 minutes of sun exposure most days of the week is important. Metabolic panel (which checks salts, blood sugar, kidney and liver functions) is normal. Thyroid function is normal though there is one value that is just a little above normal.  Everything looks good. Continue with healthy nutrition, regular exercise for weight loss.  Copy to pt. ______

## 2014-04-12 NOTE — Progress Notes (Signed)
S:  This 43 y.o. AA female is here for Type II DM follow-up. Medication compliance is good; pt was taking Metformin once a day but increased to bid when she saw FSBS remaining elevated. She monitors FSBS w/ most values in low to mid 100s. She follows a meal plan and is walking most days of the week. She has lost 5 lbs in 3.5 months. No reported or measured episodes of hypoglycemia. She feels good about self-management. She is frustrated about the slowness of weight loss. She has a hx of Vitamin D def but is taking OTC MVI and occasional Vit D 1000 units.  Patient Active Problem List   Diagnosis Date Noted  . Obesity, unspecified 12/26/2013  . Type II or unspecified type diabetes mellitus without mention of complication, not stated as uncontrolled 12/26/2013  . Chest pain 10/17/2013  . Pedal edema 10/17/2013  . Menorrhagia 10/17/2013  . Microcytic anemia 10/17/2013  . Seizure   . 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 PIH (pregnancy induced hypertension) 01/25/2012  . Fibroids 01/25/2012  . Breast mass, left 01/25/2012  . STD (sexually transmitted disease) 01/25/2012  . Insulin resistance 01/25/2012  . Migraine   . ASTHMA 01/17/2007  . BIPOLAR DISORDER 12/07/2006  . Allergic Rhinitis, Cause Unspecified 12/07/2006  . GASTROESOPHAGEAL REFLUX, NO ESOPHAGITIS 12/07/2006    Prior to Admission medications   Medication Sig Start Date End Date Taking? Authorizing Provider  docusate sodium (COLACE) 100 MG capsule Take 1 capsule (100 mg total) by mouth 2 (two) times daily. 11/29/13  Yes Ena Dawley, MD  ferrous sulfate 325 (65 FE) MG tablet Take 325 mg by mouth 2 (two) times daily with a meal. 10/18/13  Yes Delfina Redwood, MD  ibuprofen (ADVIL,MOTRIN) 800 MG tablet Take 1 tablet (800 mg total) by mouth every 8 (eight) hours as needed. 11/29/13  Yes Ena Dawley, MD  metFORMIN (GLUCOPHAGE) 500 MG tablet Take 1 tablet (500 mg total) by mouth 2 (two) times daily with a meal. 12/26/13   Yes Barton Fanny, MD  Pediatric Multivit-Minerals-C (KIDS GUMMY BEAR VITAMINS) CHEW Chew 1 each by mouth daily.   Yes Historical Provider, MD  VENTOLIN HFA 108 (90 BASE) MCG/ACT inhaler INHALE 2 PUFFS INTO THE LUNGS EVERY 4 (FOUR) HOURS AS NEEDED. FOR SHORTNESS OF BREATH   Yes Barton Fanny, MD   PMHx, Surg Hx, Soc and Fam Hx reviewed.  ROS: AS per HPI; negative for fatigue, vision disturbance, CP or tightness, palpitations, SOB, HA, dizziness, weakness or syncope. Sleep hygiene is good.  O: Filed Vitals:   04/10/14 1412  BP: 120/80  Pulse:   Temp:   Resp:    GEN: in NAD; WN,WD. HENT: [place]?AT; EOMI w/ clear conj/sclerae. Ext ears/nose/oral area normal w/ moist mucosa. COR: RRR. No edema. LUNGS: Normal resp rate and effort. SKIN: W&D; intact w/o diaphoresis or erythema. NEURO: A&O x 3; CNs intact. Nonfocal.  A1c= 6.0%  A/P: Type II or unspecified type diabetes mellitus without mention of complication, not stated as uncontrolled - Well controlled and praised pt's effort. Encouraged continuation w/ current plan. Plan: Comprehensive metabolic panel, POCT glycosylated hemoglobin (Hb A1C)  Obesity, unspecified - Encouraged continuation of current self-management plan- slow and steady weight reduction. Plan: Vitamin D, 25-hydroxy, Thyroid Panel With TSH  History of vitamin D deficiency - Plan: Vitamin D, 25-hydroxy

## 2014-07-28 ENCOUNTER — Ambulatory Visit (INDEPENDENT_AMBULATORY_CARE_PROVIDER_SITE_OTHER): Payer: BC Managed Care – PPO | Admitting: Family Medicine

## 2014-07-28 VITALS — BP 120/88 | HR 72 | Temp 98.6°F | Resp 16 | Ht 63.5 in | Wt 241.6 lb

## 2014-07-28 DIAGNOSIS — H8113 Benign paroxysmal vertigo, bilateral: Secondary | ICD-10-CM

## 2014-07-28 MED ORDER — HYDROXYZINE HCL 25 MG PO TABS: 25.0000 mg | ORAL_TABLET | Freq: Three times a day (TID) | ORAL | Status: DC | PRN

## 2014-07-28 MED ORDER — ONDANSETRON 4 MG PO TBDP: 4.0000 mg | ORAL_TABLET | Freq: Three times a day (TID) | ORAL | Status: DC | PRN

## 2014-07-28 NOTE — Patient Instructions (Addendum)
Epley Maneuver Self-Care WHAT IS THE EPLEY MANEUVER? The Epley maneuver is an exercise you can do to relieve symptoms of benign paroxysmal positional vertigo (BPPV). This condition is often just referred to as vertigo. BPPV is caused by the movement of tiny crystals (canaliths) inside your inner ear. The accumulation and movement of canaliths in your inner ear causes a sudden spinning sensation (vertigo) when you move your head to certain positions. Vertigo usually lasts about 30 seconds. BPPV usually occurs in just one ear. If you get vertigo when you lie on your left side, you probably have BPPV in your left ear. Your health care provider can tell you which ear is involved.  BPPV may be caused by a head injury. Many people older than 50 get BPPV for unknown reasons. If you have been diagnosed with BPPV, your health care provider may teach you how to do this maneuver. BPPV is not life threatening (benign) and usually goes away in time.  WHEN SHOULD I PERFORM THE EPLEY MANEUVER? You can do this maneuver at home whenever you have symptoms of vertigo. You may do the Epley maneuver up to 3 times a day until your symptoms of vertigo go away. HOW SHOULD I DO THE EPLEY MANEUVER? 1. Sit on the edge of a bed or table with your back straight. Your legs should be extended or hanging over the edge of the bed or table.  2. Turn your head halfway toward the affected ear.  3. Lie backward quickly with your head turned until you are lying flat on your back. You may want to position a pillow under your shoulders.  4. Hold this position for 30 seconds. You may experience an attack of vertigo. This is normal. Hold this position until the vertigo stops. 5. Then turn your head to the opposite direction until your unaffected ear is facing the floor.  6. Hold this position for 30 seconds. You may experience an attack of vertigo. This is normal. Hold this position until the vertigo stops. 7. Now turn your whole body to  the same side as your head. Hold for another 30 seconds.  8. You can then sit back up. ARE THERE RISKS TO THIS MANEUVER? In some cases, you may have other symptoms (such as changes in your vision, weakness, or numbness). If you have these symptoms, stop doing the maneuver and call your health care provider. Even if doing these maneuvers relieves your vertigo, you may still have dizziness. Dizziness is the sensation of light-headedness but without the sensation of movement. Even though the Epley maneuver may relieve your vertigo, it is possible that your symptoms will return within 5 years. WHAT SHOULD I DO AFTER THIS MANEUVER? After doing the Epley maneuver, you can return to your normal activities. Ask your doctor if there is anything you should do at home to prevent vertigo. This may include:  Sleeping with two or more pillows to keep your head elevated.  Not sleeping on the side of your affected ear.  Getting up slowly from bed.  Avoiding sudden movements during the day.  Avoiding extreme head movement, like looking up or bending over.  Wearing a cervical collar to prevent sudden head movements. WHAT SHOULD I DO IF MY SYMPTOMS GET WORSE? Call your health care provider if your vertigo gets worse. Call your provider right way if you have other symptoms, including:   Nausea.  Vomiting.  Headache.  Weakness.  Numbness.  Vision changes. Document Released: 10/01/2013 Document Reviewed: 10/01/2013 ExitCare   Patient Information 2015 [place]. This information is not intended to replace advice given to you by your health care provider. Make sure you discuss any questions you have with your health care provider. Labyrinthitis (Inner Ear Inflammation) Your exam shows you have an inner ear disturbance or labyrinthitis. The cause of this condition is not known. But it may be due to a virus infection. The symptoms of labyrinthitis include vertigo or dizziness made worse by motion,  nausea and vomiting. The onset of labyrinthitis may be very sudden. It usually lasts for a few days and then clears up over 1-2 weeks. The treatment of an inner ear disturbance includes bed rest and medications to reduce dizziness, nausea, and vomiting. You should stay away from alcohol, tranquilizers, caffeine, nicotine, or any medicine your doctor thinks may make your symptoms worse. Further testing may be needed to evaluate your hearing and balance system. Please see your doctor or go to the emergency room right away if you have:  Increasing vertigo, earache, loss of hearing, or ear drainage.  Headache, blurred vision, trouble walking, fainting, or fever.  Persistent vomiting, dehydration, or extreme weakness. Document Released: 09/26/2005 Document Revised: 12/19/2011 Document Reviewed: 03/14/2007 [facility] Patient Information 2015 [place], [place]. This information is not intended to replace advice given to you by your health care provider. Make sure you discuss any questions you have with your health care provider.

## 2014-07-28 NOTE — Progress Notes (Signed)
   Subjective:    Patient ID: Holly Hopkins, female    DOB: 1971/08/27, 43 y.o.   MRN: [id number]  HPI This is a pleasant 43 yo female who is brought in by her husband. The patient has had nausea and dizziness that started 4 days ago. She had sensation of the room spinning when she raised her head. Dizziness improved with being still. Took some of her mother's zofran 3 days ago with good relief of nausea. Has had cough and nasal congestion that started prior to dizziness. Her dizziness is better today, worse if she leans over. Episodes last for a few minutes. Has felt well prior to 4 days ago. Sons with similar symptoms- runny nose and dizziness.   Has not been checking blood sugar recently. Has a follow up appointment with Dr. Leward Quan in a couple of weeks.  Review of Systems No fever/chills, minimal dry cough, headache over eyes, some sore throat- now resolved. No chest pain, no SOB, no wheeze. No numbness or tingling of arms/legs, no weakness.     Objective:   Physical Exam  Vitals reviewed. Constitutional: She is oriented to person, place, and time. She appears well-developed and well-nourished.  HENT:  Head: Normocephalic and atraumatic.  Right Ear: External ear and ear canal normal. Tympanic membrane is scarred and retracted.  Left Ear: External ear and ear canal normal. Tympanic membrane is scarred and retracted.  Nose: Mucosal edema present.  Eyes: Conjunctivae and EOM are normal. Pupils are equal, round, and reactive to light. Right eye exhibits no discharge. Left eye exhibits no discharge.  Neck: Normal range of motion. Neck supple. No thyromegaly present.  Cardiovascular: Normal rate, regular rhythm and normal heart sounds.   Pulmonary/Chest: Effort normal and breath sounds normal.  Abdominal: Soft. Bowel sounds are normal. She exhibits no distension and no mass. There is no tenderness. There is no rebound and no guarding.  Musculoskeletal: Normal range of motion.    Lymphadenopathy:    She has no cervical adenopathy.  Neurological: She is alert and oriented to person, place, and time. Coordination normal.  Skin: Skin is warm and dry.  Psychiatric: She has a normal mood and affect. Her behavior is normal. Judgment and thought content normal.   Positive Epley maneuvers bilaterally- left worse than right. Dizziness resolves quickly after returning to sitting position.     Assessment & Plan:  1. Benign paroxysmal positional vertigo, bilateral -Provided written and verbal information regarding diagnosis and treatment. - hydrOXYzine (ATARAX/VISTARIL) 25 MG tablet; Take 1 tablet (25 mg total) by mouth every 8 (eight) hours as needed for nausea.  Dispense: 20 tablet; Refill: 0 - ondansetron (ZOFRAN ODT) 4 MG disintegrating tablet; Take 1 tablet (4 mg total) by mouth every 8 (eight) hours as needed for nausea or vomiting.  Dispense: 20 tablet; Refill: 0 -RTC if no improvement in 2-3 days, sooner if worsening symptoms- fever/chills, weakness, visual changes, falls.  -Follow up as scheduled with Dr. Leward Quan next month as scheduled.   Elby Beck, FNP-BC  [facility]  07/28/2014 3:41 PM

## 2014-08-13 ENCOUNTER — Encounter: Payer: BC Managed Care – PPO | Admitting: Family Medicine

## 2014-09-18 ENCOUNTER — Encounter: Payer: BC Managed Care – PPO | Admitting: Family Medicine

## 2015-01-12 ENCOUNTER — Ambulatory Visit (INDEPENDENT_AMBULATORY_CARE_PROVIDER_SITE_OTHER): Payer: BLUE CROSS/BLUE SHIELD

## 2015-01-12 ENCOUNTER — Ambulatory Visit (INDEPENDENT_AMBULATORY_CARE_PROVIDER_SITE_OTHER): Payer: BLUE CROSS/BLUE SHIELD | Admitting: Physician Assistant

## 2015-01-12 ENCOUNTER — Ambulatory Visit: Payer: Self-pay

## 2015-01-12 VITALS — BP 124/84 | HR 78 | Temp 98.5°F | Ht 63.25 in | Wt 240.0 lb

## 2015-01-12 DIAGNOSIS — M25571 Pain in right ankle and joints of right foot: Secondary | ICD-10-CM

## 2015-01-12 MED ORDER — MELOXICAM 7.5 MG PO TABS
7.5000 mg | ORAL_TABLET | Freq: Every day | ORAL | Status: DC
Start: 2015-01-12 — End: 2015-10-28

## 2015-01-12 NOTE — Progress Notes (Signed)
Subjective:    Patient ID: Holly Hopkins, female    DOB: [birth year], 44 y.o.   MRN: [id number]  Chief Complaint  Patient presents with  . Foot Injury    Hurt playing basketball x 1 month ago-Flared up yesterday after walking the track   Prior to Admission medications   Medication Sig Start Date End Date Taking? Authorizing Provider  VENTOLIN HFA 108 (90 BASE) MCG/ACT inhaler INHALE 2 PUFFS INTO THE LUNGS EVERY 4 (FOUR) HOURS AS NEEDED. FOR SHORTNESS OF BREATH   Yes Barton Fanny, MD  ferrous sulfate 325 (65 FE) MG tablet Take 325 mg by mouth 2 (two) times daily with a meal. 10/18/13   Delfina Redwood, MD  hydrOXYzine (ATARAX/VISTARIL) 25 MG tablet Take 1 tablet (25 mg total) by mouth every 8 (eight) hours as needed for nausea. Patient not taking: Reported on 01/12/2015 07/28/14   Elby Beck, FNP  metFORMIN (GLUCOPHAGE) 500 MG tablet Take 1 tablet (500 mg total) by mouth 2 (two) times daily with a meal. Patient not taking: Reported on 01/12/2015 12/26/13   Barton Fanny, MD  ondansetron (ZOFRAN ODT) 4 MG disintegrating tablet Take 1 tablet (4 mg total) by mouth every 8 (eight) hours as needed for nausea or vomiting. Patient not taking: Reported on 01/12/2015 07/28/14   Elby Beck, FNP   Medications, allergies, past medical history, surgical history, family history, social history and problem list reviewed and updated.  HPI  8 yof with no pertinent pmh presents with right foot pain.   Sx initially started approx one month ago after jumping to take a shot playing bball. Thinks maybe she landed funny but not sure. Has had intermittent lateral and posterior ankle pain maybe 1-2x week since the original injury. Worse with activity like walking.   Yesterday was walking 2.5 miles and area was throbbing. Went to bed. When awoke middle of night and put weight on foot had to drop to her knees due to pain posterior right ankle.   Denies any other injuries. Denies fevers,  chills.   Review of Systems See HPI.     Objective:   Physical Exam  Constitutional: She is oriented to person, place, and time. She appears well-developed and well-nourished.  Non-toxic appearance. She does not have a sickly appearance. She does not appear ill. No distress.  BP 124/84 mmHg  Pulse 78  Temp(Src) 98.5 F (36.9 C) (Oral)  Ht 5' 3.25" (1.607 m)  Wt 240 lb (108.863 kg)  BMI 42.15 kg/m2  SpO2 97%   Musculoskeletal:       Right ankle: She exhibits decreased range of motion, swelling and ecchymosis. Tenderness. Posterior TFL tenderness found. No lateral malleolus, no medial malleolus, no AITFL, no CF ligament, no head of 5th metatarsal and no proximal fibula tenderness found. Achilles tendon normal. Achilles tendon exhibits no pain, no defect and normal Thompson's test results.       Right lower leg: Normal. She exhibits no tenderness, no bony tenderness, no swelling and no edema.  Negative Thompsons test.   TTP over posterior calcaneous. Mild TTP just posterior to lateral malleolus. Mild swelling over lateral malleolus. Mild ecchymosis base of achilles.   No pain with ankle inversion, eversion. Neg squeeze test.   Neurological: She is alert and oriented to person, place, and time.   [facility] reading (PRIMARY) by  Dr. Laney Pastor. Right ankle findings: Ankle normal. Mild spurring of achilles attachment.      Assessment & Plan:  89 yof with no pertinent pmh presents with right foot pain.   Right ankle pain - Plan: Ambulatory referral to Orthopedic Surgery, meloxicam (MOBIC) 7.5 MG tablet, DG Ankle Complete Right, CANCELED: DG Ankle Complete Left --xray no signs acute pathology --concern for achilles pathology with swelling over area, ecchymosis over area, ttp over area --> urgent referral to be seen this week by ortho, possible ultrasound --walking boot until ortho appt --mobic qd --ice, elevate  Julieta Gutting, PA-C Physician Assistant-Certified [facility]  01/13/2015 1:01 PM

## 2015-01-12 NOTE — Patient Instructions (Signed)
Please wear the walking boot until you see the ortho doctor.  Please take the mobic once daily until you see them.  Try to ice and elevate as often as possible. We will be in contact with you once we get you set to see ortho, hopefully it will be this week.

## 2015-05-15 IMAGING — CT CT ANGIO CHEST
2 of 9 series · 19 of 46 positions shown · IV contrast (APPLIED)
Comparison: October 17, 2013 chest radiograph

CLINICAL DATA: Chest pain

EXAM:
CT ANGIOGRAPHY CHEST WITH CONTRAST
TECHNIQUE: Multidetector CT imaging of the chest was performed using the
standard protocol during bolus administration of intravenous
contrast. Multiplanar CT image reconstructions including MIPs were
obtained to evaluate the vascular anatomy.
CONTRAST:  100mL OMNIPAQUE IOHEXOL 350 MG/ML SOLN

[Series 5: thins · axial · 0.59mm/px · z∈[+1172,+1357]mm · 16 of 209 slices shown]
[im 12/209  lung]
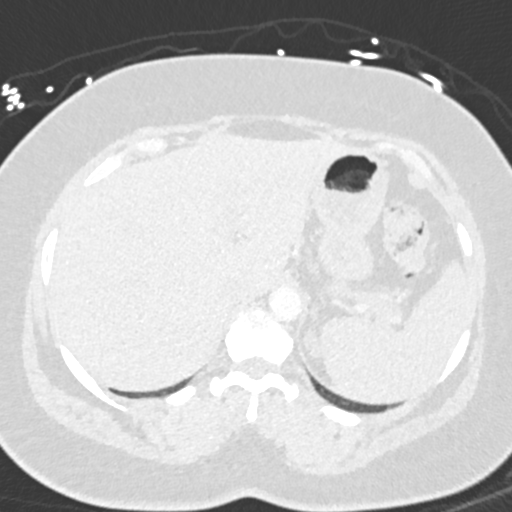
[im 24/209  soft-tissue]
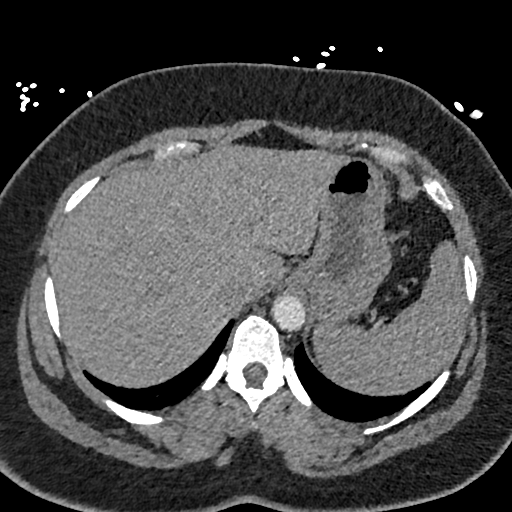
[im 35/209  lung]
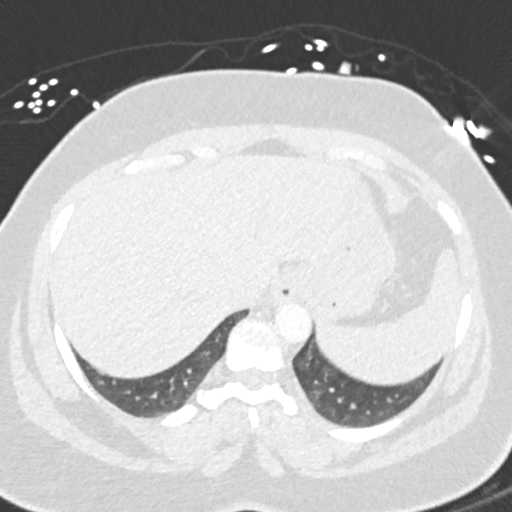
[im 47/209  soft-tissue]
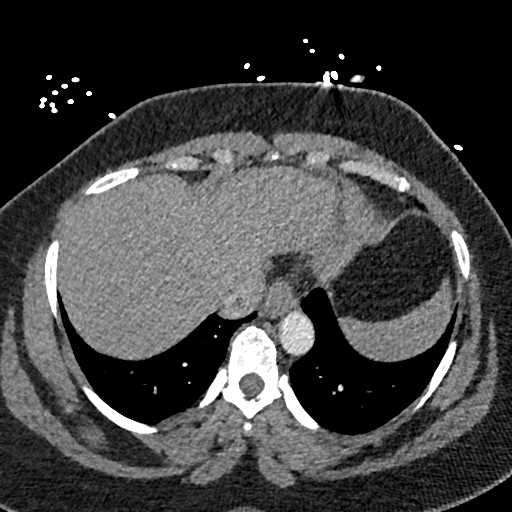
[im 58/209  lung]
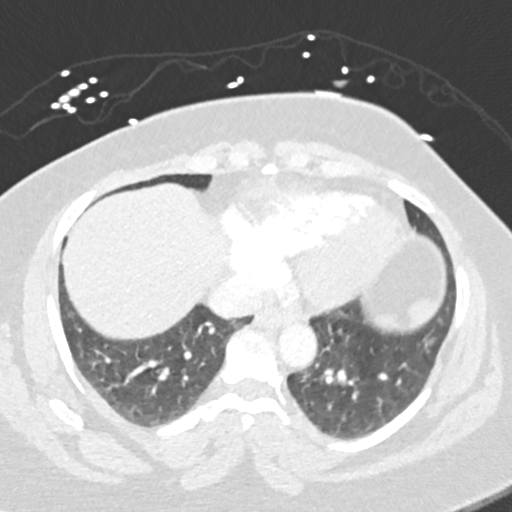
[im 70/209  soft-tissue]
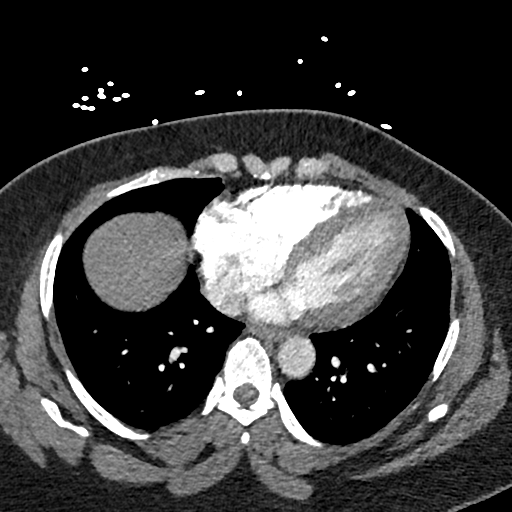
[im 81/209  lung]
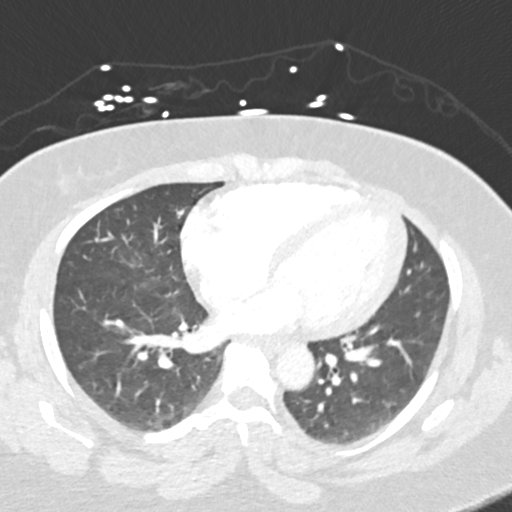
[im 93/209  soft-tissue]
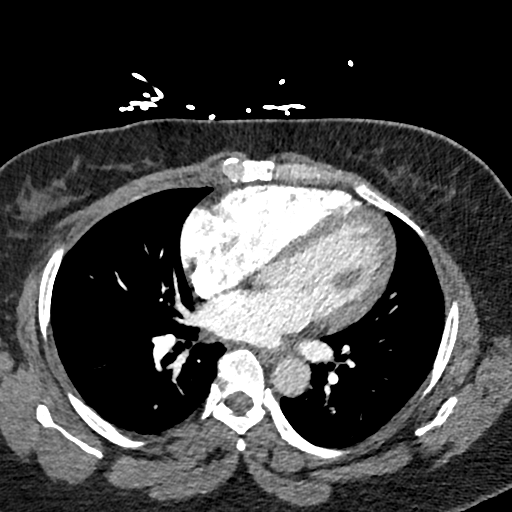
[im 116/209  lung]
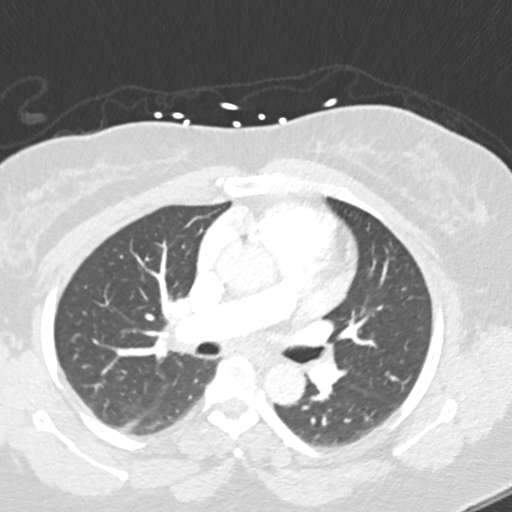
[im 128/209  soft-tissue]
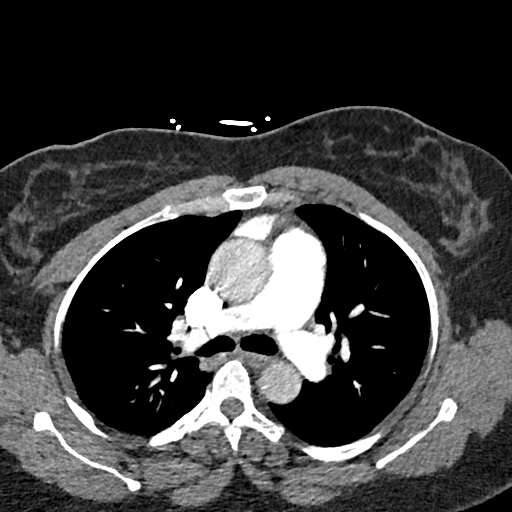
[im 139/209  lung]
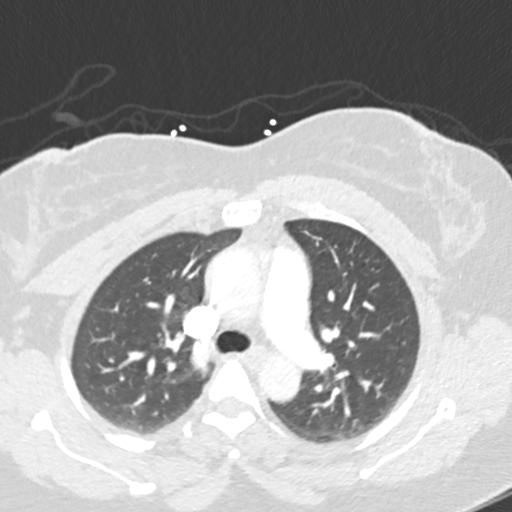
[im 151/209  soft-tissue]
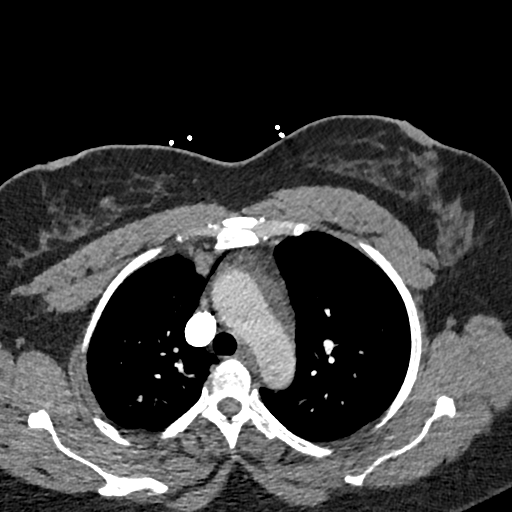
[im 162/209  lung]
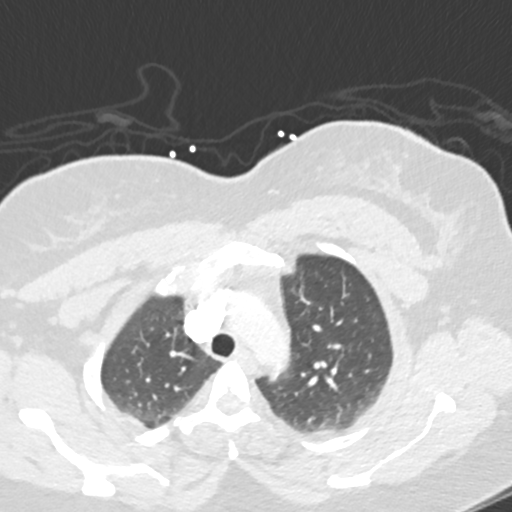
[im 174/209  soft-tissue]
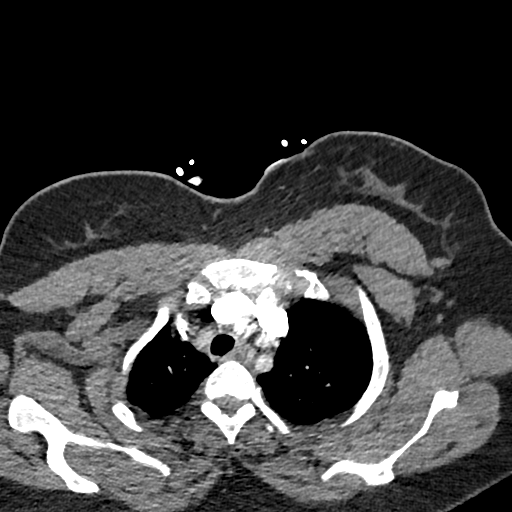
[im 185/209  lung]
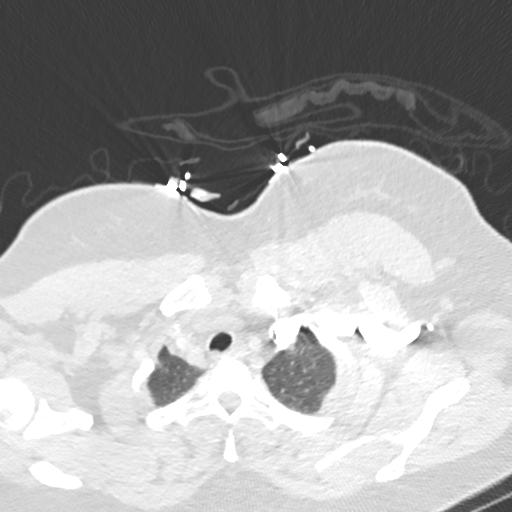
[im 197/209  soft-tissue]
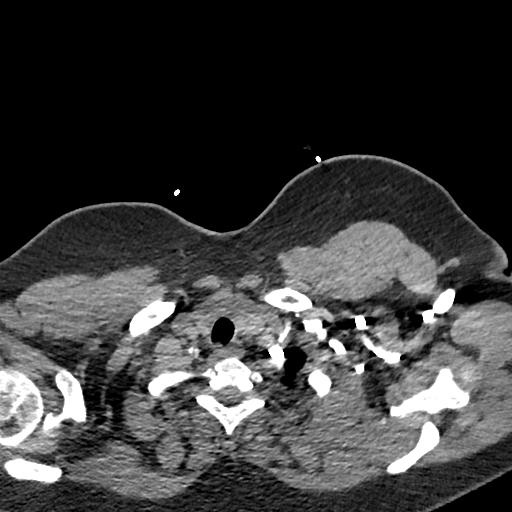

[Series 7: coronal mpr · coronal · 0.59mm/px · 3 of 146 slices shown]
[im 37/146  soft-tissue]
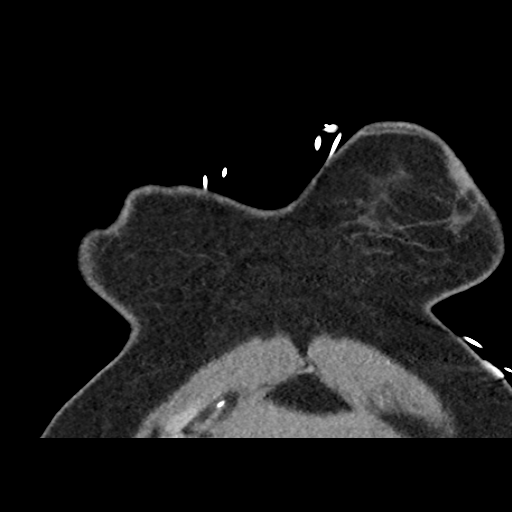
[im 73/146  soft-tissue]
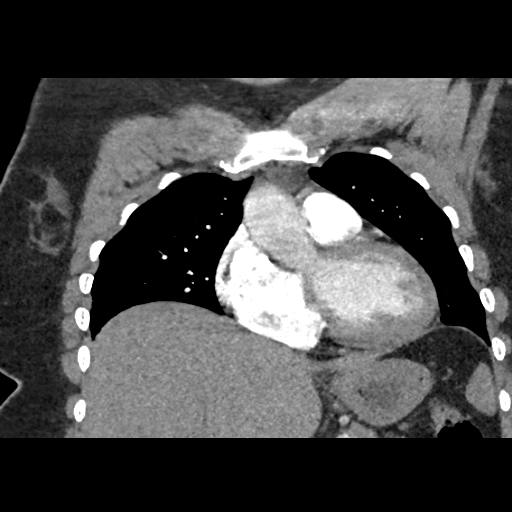
[im 109/146  soft-tissue]
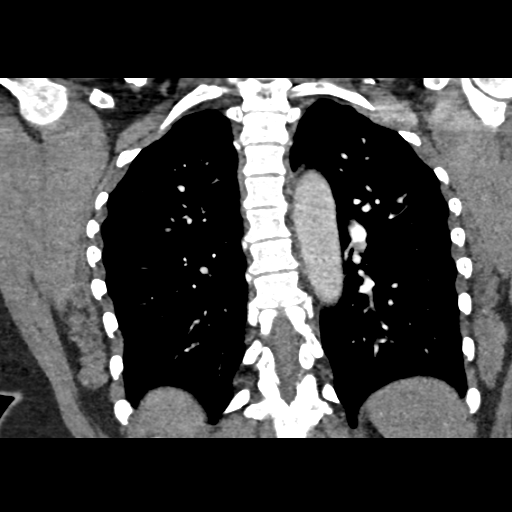

[19 of 46 positions shown; findings below may reference images not displayed]

FINDINGS: There is no demonstrable pulmonary embolus. There is no thoracic
aortic aneurysm or dissection.

The lungs are clear. There is no appreciable thoracic adenopathy.
The pericardium is not thickened.

The visualized upper abdominal structures appear normal. There are
no blastic or lytic bone lesions.

Review of the MIP images confirms the above findings.
IMPRESSION: No edema or consolidation.  No demonstrable pulmonary embolus.

## 2015-06-11 ENCOUNTER — Ambulatory Visit (INDEPENDENT_AMBULATORY_CARE_PROVIDER_SITE_OTHER): Payer: BLUE CROSS/BLUE SHIELD | Admitting: Physician Assistant

## 2015-06-11 VITALS — BP 128/72 | HR 83 | Temp 99.8°F | Resp 18 | Ht 63.5 in | Wt 240.0 lb

## 2015-06-11 DIAGNOSIS — B372 Candidiasis of skin and nail: Secondary | ICD-10-CM

## 2015-06-11 DIAGNOSIS — F988 Other specified behavioral and emotional disorders with onset usually occurring in childhood and adolescence: Secondary | ICD-10-CM

## 2015-06-11 DIAGNOSIS — L739 Follicular disorder, unspecified: Secondary | ICD-10-CM | POA: Diagnosis not present

## 2015-06-11 HISTORY — DX: Other specified behavioral and emotional disorders with onset usually occurring in childhood and adolescence: F98.8

## 2015-06-11 MED ORDER — DOXYCYCLINE HYCLATE 100 MG PO CAPS: 100.0000 mg | ORAL_CAPSULE | Freq: Two times a day (BID) | ORAL | Status: AC

## 2015-06-11 MED ORDER — FLUCONAZOLE 150 MG PO TABS: ORAL_TABLET | ORAL | Status: DC

## 2015-06-11 NOTE — Progress Notes (Signed)
[facility] [address], [place] [address] [phone]  Date:  06/11/2015   Name:  Holly Hopkins   DOB:  01/24/1971   MRN:  [id number]  PCP:  Eli Hose, MD    Chief Complaint: Rash   History of Present Illness:  This is a 44 y.o. female with PMH DM, asthma, allergic rhinitis, GERD, eczema who is presenting with a pruritic rash around bra line, under arms and neck. She will sometimes get eczema on her sides, under her arms. She noticed itchy patches in that location x 2 weeks. Skin there is discolored. 2 days ago she started getting small bumps around the itchy patches. She now has bumps on her chest and neck as well. She has not put anything on the rash. She denies fever or chills.  States there is no chance of pregnancy - she had tubes tied several years ago.  Review of Systems:  Review of Systems See HPI  Patient Active Problem List   Diagnosis Date Noted  . Obesity, unspecified 12/26/2013  . Type II or unspecified type diabetes mellitus without mention of complication, not stated as uncontrolled 12/26/2013  . Chest pain 10/17/2013  . Pedal edema 10/17/2013  . Menorrhagia 10/17/2013  . Microcytic anemia 10/17/2013  . Seizure   . 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 PIH (pregnancy induced hypertension) 01/25/2012  . Fibroids 01/25/2012  . Breast mass, left 01/25/2012  . STD (sexually transmitted disease) 01/25/2012  . Insulin resistance 01/25/2012  . Migraine   . ASTHMA 01/17/2007  . BIPOLAR DISORDER 12/07/2006  . Allergic rhinitis, cause unspecified 12/07/2006  . GASTROESOPHAGEAL REFLUX, NO ESOPHAGITIS 12/07/2006   Prior to Admission medications   Medication Sig Start Date End Date Taking? Authorizing Provider  amphetamine-dextroamphetamine (ADDERALL) 20 MG tablet Take 20 mg by mouth 2 (two) times daily.   Yes Historical Provider, MD  ferrous sulfate 325 (65 FE) MG tablet Take 325 mg by mouth 2 (two) times daily with a  meal. 10/18/13  Yes Delfina Redwood, MD  metFORMIN (GLUCOPHAGE) 500 MG tablet Take 1 tablet (500 mg total) by mouth 2 (two) times daily with a meal. 12/26/13  Yes Barton Fanny, MD  VENTOLIN HFA 108 (90 BASE) MCG/ACT inhaler INHALE 2 PUFFS INTO THE LUNGS EVERY 4 (FOUR) HOURS AS NEEDED. FOR SHORTNESS OF BREATH   Yes Barton Fanny, MD                          No Known Allergies  Past Surgical History  Procedure Laterality Date  . Tubal ligation    . Laparoscopic cholecystectomy    . Induced abortion    . Cesarean section      x2  . Dilitation & currettage/hystroscopy with novasure ablation N/A 11/29/2013    Procedure: DILATATION & CURETTAGE/HYSTEROSCOPY WITH  RESECTION;  Surgeon: Ena Dawley, MD;  Location: [facility];  Service: Gynecology;  Laterality: N/A;    Social History  Substance Use Topics  . Smoking status: Never Smoker   . Smokeless tobacco: Never Used  . Alcohol Use: 0.0 - 0.5 oz/week    0-1 drink(s) per week     Comment: socially/holidays    Family History  Problem Relation Age of Onset  . Diabetes Mother   . Heart disease Mother   . Diabetes Father   . Cancer Father     THYROID  . Hypertension Father   . Hyperlipidemia  Father   . Mental illness Brother     PSTD,DEPRESSION  . Migraines Son   . Allergy (severe) Son   . Diabetes Maternal Aunt   . Goiter Maternal Uncle   . Diabetes Paternal Aunt   . Alcohol abuse Paternal Uncle     ALCOHOL AND DRUGS  . Heart disease Maternal Grandmother   . Diabetes Maternal Grandmother   . Hypertension Maternal Grandmother   . Diabetes Maternal Grandfather   . Heart disease Maternal Grandfather   . Cancer Maternal Grandfather     QUESTION WHAT KIND  . Heart disease Paternal Grandmother   . Diabetes Paternal Grandmother   . Hypertension Paternal Grandmother   . Cancer Paternal Grandmother     BREAST  . Migraines Son   . Allergy (severe) Son   . Heart disease Maternal Aunt   . Diabetes Paternal Aunt    . Arthritis Paternal Aunt     Medication list has been reviewed and updated.  Physical Examination:  Physical Exam  Constitutional: She is oriented to person, place, and time. She appears well-developed and well-nourished. No distress.  HENT:  Head: Normocephalic and atraumatic.  Right Ear: Hearing normal.  Left Ear: Hearing normal.  Nose: Nose normal.  Eyes: Conjunctivae and lids are normal. Right eye exhibits no discharge. Left eye exhibits no discharge. No scleral icterus.  Pulmonary/Chest: Effort normal. No respiratory distress.  Musculoskeletal: Normal range of motion.  Neurological: She is alert and oriented to person, place, and time.  Skin: Skin is warm, dry and intact.  Erythematous and hyperpigmented patches on bilateral sides of trunk, located in skin fold. At edges of wound there are superimposed papules. These papules are scattered over chest and neck and seem to be associated with a hair follicle.  Psychiatric: She has a normal mood and affect. Her speech is normal and behavior is normal. Thought content normal.   BP 128/72 mmHg  Pulse 83  Temp(Src) 99.8 F (37.7 C) (Oral)  Resp 18  Ht 5' 3.5" (1.613 m)  Wt 240 lb (108.863 kg)  BMI 41.84 kg/m2  SpO2 98%  LMP 05/11/2015  Assessment and Plan:  1. Intertriginous candidiasis 2. folliculitis Discussed case with Dr. Lorelei Pont. Pt has two different rashes - candida and folliculitis. Will treat with diflucan weekly x 4 weeks and doxycycline BID x 10 days. Counseled on need to stay dry. Return if not improving in 2-4 weeks. - fluconazole (DIFLUCAN) 150 MG tablet; Take 1 tab po once a week x 4 weeks.  Dispense: 4 tablet; Refill: 0 - doxycycline (VIBRAMYCIN) 100 MG capsule; Take 1 capsule (100 mg total) by mouth 2 (two) times daily. AVOID EXCESS SUN EXPOSURE WHILE ON THIS MEDICATION  Dispense: 20 capsule; Refill: 0   Benjaman Pott. Drenda Freeze, MHS [facility]  06/11/2015

## 2015-06-11 NOTE — Patient Instructions (Signed)
Take doxy twice a day for 10 days. Take fluconazole once a week for 4 weeks. Keep dry as best you can. Return if not getting better in 2 weeks.

## 2015-06-26 ENCOUNTER — Ambulatory Visit (INDEPENDENT_AMBULATORY_CARE_PROVIDER_SITE_OTHER): Payer: BLUE CROSS/BLUE SHIELD | Admitting: Family Medicine

## 2015-06-26 VITALS — BP 130/80 | HR 95 | Temp 99.0°F | Resp 18 | Ht 63.5 in | Wt 238.8 lb

## 2015-06-26 DIAGNOSIS — R21 Rash and other nonspecific skin eruption: Secondary | ICD-10-CM | POA: Diagnosis not present

## 2015-06-26 DIAGNOSIS — R7309 Other abnormal glucose: Secondary | ICD-10-CM | POA: Diagnosis not present

## 2015-06-26 DIAGNOSIS — R7303 Prediabetes: Secondary | ICD-10-CM

## 2015-06-26 LAB — POCT GLYCOSYLATED HEMOGLOBIN (HGB A1C): Hemoglobin A1C: 6.3

## 2015-06-26 LAB — GLUCOSE, POCT (MANUAL RESULT ENTRY): POC Glucose: 123 mg/dl — AB (ref 70–99)

## 2015-06-26 LAB — POCT SKIN KOH: SKIN KOH, POC: POSITIVE

## 2015-06-26 MED ORDER — TERBINAFINE HCL 250 MG PO TABS: 250.0000 mg | ORAL_TABLET | Freq: Every day | ORAL | Status: DC

## 2015-06-26 NOTE — Patient Instructions (Signed)
Your skin scraping did show fungal elements today  we are going to treat you with terbinafine daily for the next 2 weeks  You can stop the diflucan  Please let me know if you are not improving over the next couple of days You continue to have pre-diabetes but do not yet have diabetes.   Watch your weight and we can recheck this in 6- 12 months

## 2015-06-26 NOTE — Progress Notes (Signed)
[facility] [address], [place] [address] [phone]  Date:  06/26/2015   Name:  Holly Hopkins   DOB:  [birth year]   MRN:  [id number]  PCP:  Eli Hose, MD    Chief Complaint: Rash and Other   History of Present Illness:  Holly Hopkins is a 43 y.o. very pleasant female patient who presents with the following:  She was seen her on 9/1 with what seemed like both folliculitis and candida intertrigo. Treated with doxycycline and diflucan weekly.   Here today because the rash is not gone- in fact it seems to be spreading higher onto her neck.  It is itchy and irritating.   OW she feels well She has taken all the doxy and 3 doses of diflucan She did have eczema in the past but never this bad   Patient Active Problem List   Diagnosis Date Noted  . Obesity, unspecified 12/26/2013  . Type II or unspecified type diabetes mellitus without mention of complication, not stated as uncontrolled 12/26/2013  . Chest pain 10/17/2013  . Pedal edema 10/17/2013  . Menorrhagia 10/17/2013  . Microcytic anemia 10/17/2013  . Seizure   . 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 PIH (pregnancy induced hypertension) 01/25/2012  . Fibroids 01/25/2012  . Breast mass, left 01/25/2012  . STD (sexually transmitted disease) 01/25/2012  . Insulin resistance 01/25/2012  . Migraine   . ASTHMA 01/17/2007  . BIPOLAR DISORDER 12/07/2006  . Allergic rhinitis, cause unspecified 12/07/2006  . GASTROESOPHAGEAL REFLUX, NO ESOPHAGITIS 12/07/2006    Past Medical History  Diagnosis Date  . Migraine     migraine is aura presenting before seizure.  . Seizure     stress induced; has migraine aura  . Allergy   . Depression   . History of laparoscopic cholecystectomy 1991  . H/O tubal ligation 05/15/2003  . Hypertension   . Asthma     allergy related..  . Type II or unspecified type diabetes mellitus without mention of complication, not stated as uncontrolled  12/26/2013  . Anxiety   . ADD (attention deficit disorder) 06/11/2015    Past Surgical History  Procedure Laterality Date  . Tubal ligation    . Laparoscopic cholecystectomy    . Induced abortion    . Cesarean section      x2  . Dilitation & currettage/hystroscopy with novasure ablation N/A 11/29/2013    Procedure: DILATATION & CURETTAGE/HYSTEROSCOPY WITH  RESECTION;  Surgeon: Ena Dawley, MD;  Location: [facility];  Service: Gynecology;  Laterality: N/A;    Social History  Substance Use Topics  . Smoking status: Never Smoker   . Smokeless tobacco: Never Used  . Alcohol Use: 0.0 - 0.5 oz/week    0-1 drink(s) per week     Comment: socially/holidays    Family History  Problem Relation Age of Onset  . Diabetes Mother   . Heart disease Mother   . Diabetes Father   . Cancer Father     THYROID  . Hypertension Father   . Hyperlipidemia Father   . Mental illness Brother     PSTD,DEPRESSION  . Migraines Son   . Allergy (severe) Son   . Diabetes Maternal Aunt   . Goiter Maternal Uncle   . Diabetes Paternal Aunt   . Alcohol abuse Paternal Uncle     ALCOHOL AND DRUGS  . Heart disease Maternal Grandmother   . Diabetes Maternal Grandmother   . Hypertension Maternal Grandmother   .  Diabetes Maternal Grandfather   . Heart disease Maternal Grandfather   . Cancer Maternal Grandfather     QUESTION WHAT KIND  . Heart disease Paternal Grandmother   . Diabetes Paternal Grandmother   . Hypertension Paternal Grandmother   . Cancer Paternal Grandmother     BREAST  . Migraines Son   . Allergy (severe) Son   . Heart disease Maternal Aunt   . Diabetes Paternal Aunt   . Arthritis Paternal Aunt     No Known Allergies  Medication list has been reviewed and updated.  Current Outpatient Prescriptions on File Prior to Visit  Medication Sig Dispense Refill  . amphetamine-dextroamphetamine (ADDERALL) 20 MG tablet Take 20 mg by mouth 2 (two) times daily.    . ferrous sulfate 325 (65 FE)  MG tablet Take 325 mg by mouth 2 (two) times daily with a meal.    . fluconazole (DIFLUCAN) 150 MG tablet Take 1 tab po once a week x 4 weeks. 4 tablet 0  . VENTOLIN HFA 108 (90 BASE) MCG/ACT inhaler INHALE 2 PUFFS INTO THE LUNGS EVERY 4 (FOUR) HOURS AS NEEDED. FOR SHORTNESS OF BREATH 18 g 3  . meloxicam (MOBIC) 7.5 MG tablet Take 1 tablet (7.5 mg total) by mouth daily. (Patient not taking: Reported on 06/11/2015) 30 tablet 0  . metFORMIN (GLUCOPHAGE) 500 MG tablet Take 1 tablet (500 mg total) by mouth 2 (two) times daily with a meal. (Patient not taking: Reported on 06/26/2015) 180 tablet 3   No current facility-administered medications on file prior to visit.    Review of Systems:  As per HPI- otherwise negative.   Physical Examination: Filed Vitals:   06/26/15 1730  BP: 130/80  Pulse: 95  Temp: 99 F (37.2 C)  Resp: 18   Filed Vitals:   06/26/15 1730  Height: 5' 3.5" (1.613 m)  Weight: 238 lb 12.8 oz (108.319 kg)   Body mass index is 41.63 kg/(m^2). Ideal Body Weight: Weight in (lb) to have BMI = 25: 143.1  GEN: WDWN, NAD, Non-toxic, A & O x 3, obese, looks well HEENT: Atraumatic, Normocephalic. Neck supple. No masses, No LAD.  No oral lesions Ears and Nose: No external deformity. CV: RRR, No M/G/R. No JVD. No thrill. No extra heart sounds. PULM: CTA B, no wheezes, crackles, rhonchi. No retractions. No resp. distress. No accessory muscle use. ABD: S, NT, ND, +BS. No rebound. No HSM. EXTR: No c/c/e NEURO Normal gait.  PSYCH: Normally interactive. Conversant. Not depressed or anxious appearing.  Calm demeanor.  She has confluent patches of hyperpigmented rash under her breasts, in her axillae, and in the folds of her neck.  It is somewhat rough. No weeping.  It is not particularly red at this point   Lab Results  Component Value Date   HGBA1C 6.0 04/10/2014    Results for orders placed or performed in visit on 06/26/15  POCT glucose (manual entry)  Result Value Ref  Range   POC Glucose 123 (A) 70 - 99 mg/dl  POCT glycosylated hemoglobin (Hb A1C)  Result Value Ref Range   Hemoglobin A1C 6.3   POCT Skin KOH  Result Value Ref Range   Skin KOH, POC Positive     Assessment and Plan: Rash and nonspecific skin eruption - Plan: POCT Skin KOH, terbinafine (LAMISIL) 250 MG tablet  Pre-diabetes - Plan: POCT glucose (manual entry), POCT glycosylated hemoglobin (Hb A1C), Comprehensive metabolic panel  Skin KOH positive today Will start on lamisil 250 daily for  2 weeks She has been dx with pre-diabetes in the past.  Advised that this is still the case. She will work on her diet Check CMP as she is going to use lamisil,but normal liver functio na year ago  Signed Lamar Blinks, MD

## 2015-06-27 ENCOUNTER — Encounter: Payer: Self-pay | Admitting: Family Medicine

## 2015-06-27 LAB — COMPREHENSIVE METABOLIC PANEL
ALK PHOS: 70 U/L (ref 33–115)
ALT: 15 U/L (ref 6–29)
AST: 16 U/L (ref 10–35)
Albumin: 4.1 g/dL (ref 3.6–5.1)
BILIRUBIN TOTAL: 0.3 mg/dL (ref 0.2–1.2)
BUN: 13 mg/dL (ref 7–25)
CALCIUM: 9.4 mg/dL (ref 8.6–10.2)
CO2: 27 mmol/L (ref 20–31)
Chloride: 101 mmol/L (ref 98–110)
Creat: 0.9 mg/dL (ref 0.50–1.10)
GLUCOSE: 102 mg/dL — AB (ref 65–99)
Potassium: 4 mmol/L (ref 3.5–5.3)
Sodium: 139 mmol/L (ref 135–146)
Total Protein: 6.9 g/dL (ref 6.1–8.1)

## 2015-07-01 ENCOUNTER — Telehealth: Payer: Self-pay

## 2015-07-01 NOTE — Telephone Encounter (Signed)
Mailed copy of lab results to patient. 

## 2015-07-07 IMAGING — CR DG CHEST 2V
2 series · 2 of 2 positions shown · non-contrast
Comparison: None.

CLINICAL DATA: Substernal chest/back pain

EXAM:
CHEST  2 VIEW

[PA]
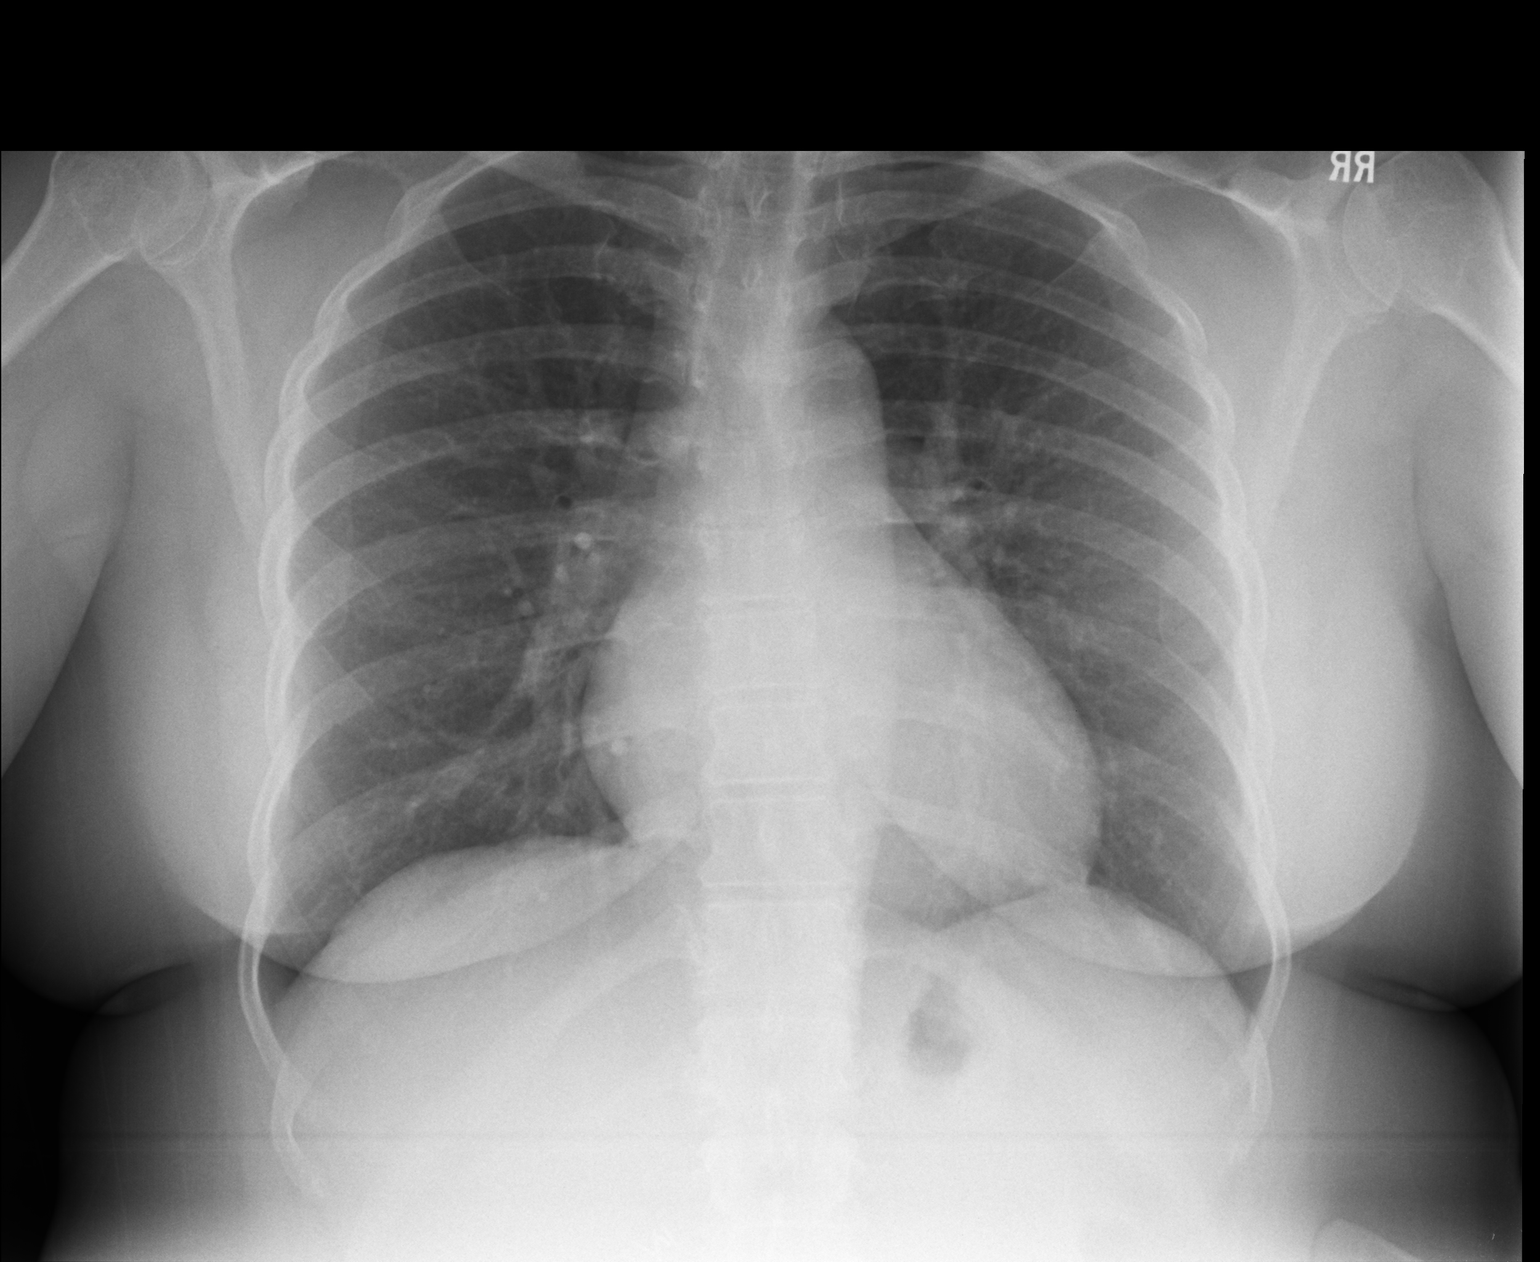

[lateral]
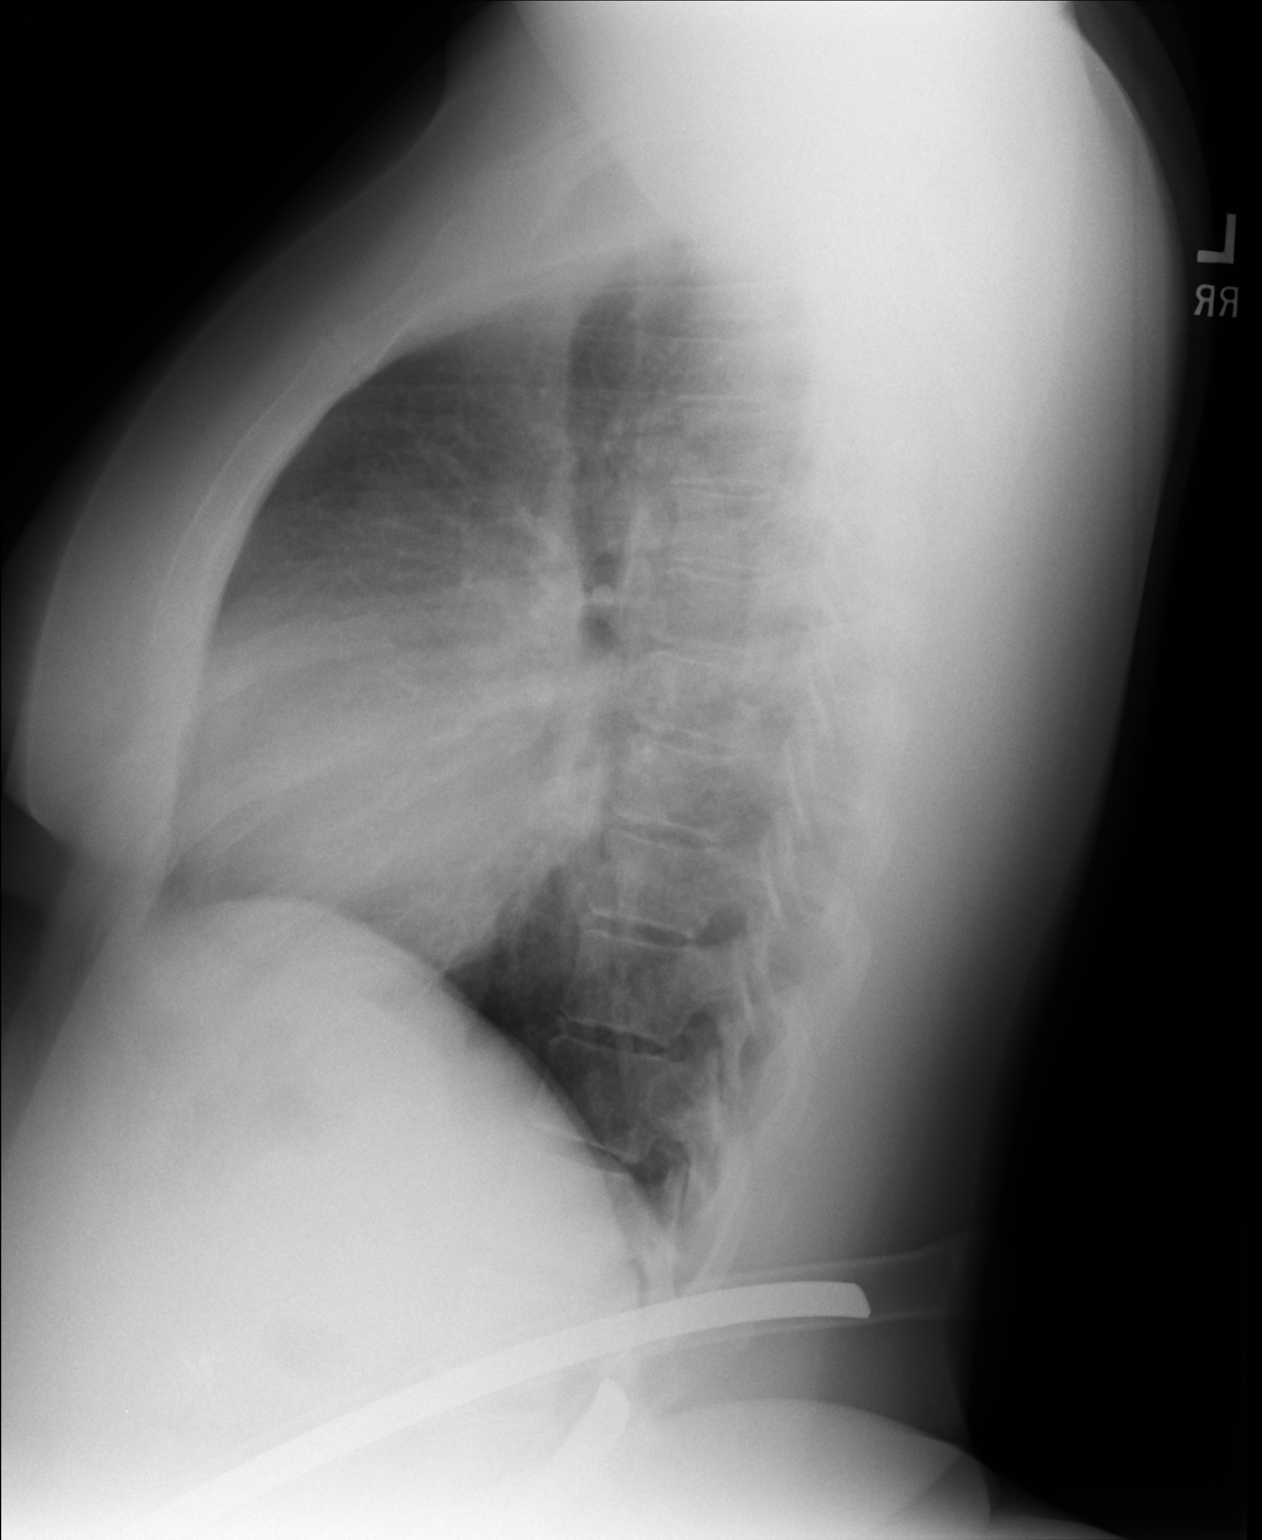

[2 of 2 positions shown; findings below may reference images not displayed]

FINDINGS: Lungs are clear.  No pleural effusion or pneumothorax.

The heart is normal in size.

Visualized osseous structures are within normal limits.
IMPRESSION: Normal chest radiographs.

## 2015-10-28 ENCOUNTER — Ambulatory Visit (INDEPENDENT_AMBULATORY_CARE_PROVIDER_SITE_OTHER): Payer: Managed Care, Other (non HMO) | Admitting: Family Medicine

## 2015-10-28 ENCOUNTER — Encounter: Payer: Self-pay | Admitting: Family Medicine

## 2015-10-28 ENCOUNTER — Encounter (INDEPENDENT_AMBULATORY_CARE_PROVIDER_SITE_OTHER): Payer: Managed Care, Other (non HMO) | Admitting: Family Medicine

## 2015-10-28 VITALS — BP 106/70 | HR 86 | Temp 99.0°F | Resp 16 | Ht 63.75 in | Wt 220.4 lb

## 2015-10-28 DIAGNOSIS — E119 Type 2 diabetes mellitus without complications: Secondary | ICD-10-CM | POA: Diagnosis not present

## 2015-10-28 DIAGNOSIS — E669 Obesity, unspecified: Secondary | ICD-10-CM | POA: Diagnosis not present

## 2015-10-28 LAB — LIPID PANEL
CHOL/HDL RATIO: 2 ratio (ref <=5.0)
Cholesterol: 131 mg/dL (ref 125–200)
HDL: 66 mg/dL (ref >=46)
LDL CALC: 46 mg/dL (ref <130)
Triglycerides: 94 mg/dL (ref <150)
VLDL: 19 mg/dL (ref <30)

## 2015-10-28 LAB — CBC
HEMATOCRIT: 41.5 % (ref 36.0–46.0)
Hemoglobin: 14 g/dL (ref 12.0–15.0)
MCH: 27.4 pg (ref 26.0–34.0)
MCHC: 33.7 g/dL (ref 30.0–36.0)
MCV: 81.2 fL (ref 78.0–100.0)
MPV: 10.1 fL (ref 8.6–12.4)
Platelets: 279 10*3/uL (ref 150–400)
RBC: 5.11 MIL/uL (ref 3.87–5.11)
RDW: 13.8 % (ref 11.5–15.5)
WBC: 4.9 10*3/uL (ref 4.0–10.5)

## 2015-10-28 LAB — COMPREHENSIVE METABOLIC PANEL
ALT: 13 U/L (ref 6–29)
AST: 15 U/L (ref 10–35)
Albumin: 4.2 g/dL (ref 3.6–5.1)
Alkaline Phosphatase: 73 U/L (ref 33–115)
BUN: 12 mg/dL (ref 7–25)
CALCIUM: 9.4 mg/dL (ref 8.6–10.2)
CO2: 28 mmol/L (ref 20–31)
Chloride: 102 mmol/L (ref 98–110)
Creat: 0.95 mg/dL (ref 0.50–1.10)
GLUCOSE: 123 mg/dL — AB (ref 65–99)
POTASSIUM: 4.5 mmol/L (ref 3.5–5.3)
Sodium: 138 mmol/L (ref 135–146)
Total Bilirubin: 0.4 mg/dL (ref 0.2–1.2)
Total Protein: 7.1 g/dL (ref 6.1–8.1)

## 2015-10-28 LAB — MICROALBUMIN, URINE: Microalb, Ur: 1.1 mg/dL

## 2015-10-28 NOTE — Progress Notes (Signed)
[facility] [address] [place] [address] [phone]  Date:  10/28/2015   Name:  Holly Hopkins   DOB:  Oct 25, 1970   MRN:  [id number]  PCP:  Eli Hose, MD    Chief Complaint: No chief complaint on file.   History of Present Illness:  Holly Hopkins is a 45 y.o. very pleasant female patient who presents with the following:  Here today for a recheck of her DM.  She is feeling well.   She has lost about 10 lbs, has done this just by increasing her exercise and keeping track of her daily steps.  She is also doing strength training by following some exercises from her son who is a wrestler She is fasting today for labs.   She continues to have regular menses She has been dx with ADHD- she is now on adderall.  This is rx by her neuropsychologist.   She is overdue for her mammogam She still sees OBG She uses clonidine for anxiety, not for HTN   Lab Results  Component Value Date   HGBA1C 6.3 06/26/2015     Patient Active Problem List   Diagnosis Date Noted  . Obesity, unspecified 12/26/2013  . Type II or unspecified type diabetes mellitus without mention of complication, not stated as uncontrolled 12/26/2013  . Chest pain 10/17/2013  . Pedal edema 10/17/2013  . Menorrhagia 10/17/2013  . Microcytic anemia 10/17/2013  . Seizure ([place])   . 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 PIH (pregnancy induced hypertension) 01/25/2012  . Fibroids 01/25/2012  . Breast mass, left 01/25/2012  . STD (sexually transmitted disease) 01/25/2012  . Insulin resistance 01/25/2012  . Migraine   . ASTHMA 01/17/2007  . BIPOLAR DISORDER 12/07/2006  . Allergic rhinitis, cause unspecified 12/07/2006  . GASTROESOPHAGEAL REFLUX, NO ESOPHAGITIS 12/07/2006    Past Medical History  Diagnosis Date  . Migraine     migraine is aura presenting before seizure.  . Seizure     stress induced; has migraine aura  . Allergy   . Depression   . History of  laparoscopic cholecystectomy 1991  . H/O tubal ligation 05/15/2003  . Hypertension   . Asthma     allergy related..  . Type II or unspecified type diabetes mellitus without mention of complication, not stated as uncontrolled 12/26/2013  . Anxiety   . ADD (attention deficit disorder) 06/11/2015    Past Surgical History  Procedure Laterality Date  . Tubal ligation    . Laparoscopic cholecystectomy    . Induced abortion    . Cesarean section      x2  . Dilitation & currettage/hystroscopy with novasure ablation N/A 11/29/2013    Procedure: DILATATION & CURETTAGE/HYSTEROSCOPY WITH  RESECTION;  Surgeon: Ena Dawley, MD;  Location: [facility];  Service: Gynecology;  Laterality: N/A;    Social History  Substance Use Topics  . Smoking status: Never Smoker   . Smokeless tobacco: Never Used  . Alcohol Use: 0.0 - 0.5 oz/week    0-1 drink(s) per week     Comment: socially/holidays    Family History  Problem Relation Age of Onset  . Diabetes Mother   . Heart disease Mother   . Diabetes Father   . Cancer Father     THYROID  . Hypertension Father   . Hyperlipidemia Father   . Mental illness Brother     PSTD,DEPRESSION  . Migraines Son   . Allergy (severe) Son   .  Diabetes Maternal Aunt   . Goiter Maternal Uncle   . Diabetes Paternal Aunt   . Alcohol abuse Paternal Uncle     ALCOHOL AND DRUGS  . Heart disease Maternal Grandmother   . Diabetes Maternal Grandmother   . Hypertension Maternal Grandmother   . Diabetes Maternal Grandfather   . Heart disease Maternal Grandfather   . Cancer Maternal Grandfather     QUESTION WHAT KIND  . Heart disease Paternal Grandmother   . Diabetes Paternal Grandmother   . Hypertension Paternal Grandmother   . Cancer Paternal Grandmother     BREAST  . Migraines Son   . Allergy (severe) Son   . Heart disease Maternal Aunt   . Diabetes Paternal Aunt   . Arthritis Paternal Aunt     No Known Allergies  Medication list has been reviewed and  updated.  Current Outpatient Prescriptions on File Prior to Visit  Medication Sig Dispense Refill  . amphetamine-dextroamphetamine (ADDERALL) 20 MG tablet Take 20 mg by mouth 2 (two) times daily.    . ferrous sulfate 325 (65 FE) MG tablet Take 325 mg by mouth 2 (two) times daily with a meal.    . fluconazole (DIFLUCAN) 150 MG tablet Take 1 tab po once a week x 4 weeks. 4 tablet 0  . meloxicam (MOBIC) 7.5 MG tablet Take 1 tablet (7.5 mg total) by mouth daily. (Patient not taking: Reported on 06/11/2015) 30 tablet 0  . terbinafine (LAMISIL) 250 MG tablet Take 1 tablet (250 mg total) by mouth daily. 14 tablet 0  . VENTOLIN HFA 108 (90 BASE) MCG/ACT inhaler INHALE 2 PUFFS INTO THE LUNGS EVERY 4 (FOUR) HOURS AS NEEDED. FOR SHORTNESS OF BREATH 18 g 3   No current facility-administered medications on file prior to visit.    Review of Systems:  As per HPI- otherwise negative.   Physical Examination: Filed Vitals:   10/28/15 0822  BP: 106/70  Pulse: 86  Temp: 99 F (37.2 C)  Resp: 16    GEN: WDWN, NAD, Non-toxic, A & O x 3, obese but has lost.  Looks well today HEENT: Atraumatic, Normocephalic. Neck supple. No masses, No LAD.  Bilateral TM wnl, oropharynx normal.  PEERL,EOMI.   Ears and Nose: No external deformity. CV: RRR, No M/G/R. No JVD. No thrill. No extra heart sounds. PULM: CTA B, no wheezes, crackles, rhonchi. No retractions. No resp. distress. No accessory muscle use. ABD: S, NT, ND, +BS. No rebound. No HSM. EXTR: No c/c/e NEURO Normal gait.  PSYCH: Normally interactive. Conversant. Not depressed or anxious appearing.  Calm demeanor.  Foot exam today- normal   Assessment and Plan: Controlled type 2 diabetes mellitus without complication, without long-term current use of insulin (HCC) - Plan: CBC, Comprehensive metabolic panel, Lipid panel, Hemoglobin A1c, Microalbumin, urine  Obesity - Plan: Lipid panel  She has lost 20 lbs since September- congratulated her efforts  Await  her labs and plan follow-up accordingly   Signed Lamar Blinks, MD

## 2015-10-28 NOTE — Patient Instructions (Addendum)
It was great to see you today- I will be in touch with your labs asap Keep up the great work with exercise and diet.  You are doing great!    Please call and schedule a mammogram.  There are several options in town, one good choice is:  [facility] ?  Address: [address], [place], [place] [address]  [phone]  I will be moving to the [facility] Primary care office at [facility] next month-  Address: [address] [place], [place] [address] Phone: [phone]  I am glad to continue to see you there if you like!   Wt Readings from Last 3 Encounters:  10/28/15 220 lb 6.4 oz (99.973 kg)  06/26/15 238 lb 12.8 oz (108.319 kg)  06/11/15 240 lb (108.863 kg)

## 2015-10-29 ENCOUNTER — Encounter: Payer: Self-pay | Admitting: Family Medicine

## 2015-10-29 LAB — HEMOGLOBIN A1C
HEMOGLOBIN A1C: 6.3 % — AB (ref <5.7)
Mean Plasma Glucose: 134 mg/dL — ABNORMAL HIGH (ref <117)

## 2015-12-25 ENCOUNTER — Emergency Department (HOSPITAL_BASED_OUTPATIENT_CLINIC_OR_DEPARTMENT_OTHER): Payer: Managed Care, Other (non HMO)

## 2015-12-25 ENCOUNTER — Encounter (HOSPITAL_BASED_OUTPATIENT_CLINIC_OR_DEPARTMENT_OTHER): Payer: Self-pay | Admitting: Emergency Medicine

## 2015-12-25 ENCOUNTER — Emergency Department (HOSPITAL_BASED_OUTPATIENT_CLINIC_OR_DEPARTMENT_OTHER)
Admission: EM | Admit: 2015-12-25 | Discharge: 2015-12-25 | Disposition: A | Discharge status: Home / Self Care | Payer: Managed Care, Other (non HMO) | Attending: Emergency Medicine | Admitting: Emergency Medicine

## 2015-12-25 DIAGNOSIS — Z79899 Other long term (current) drug therapy: Secondary | ICD-10-CM | POA: Insufficient documentation

## 2015-12-25 DIAGNOSIS — E119 Type 2 diabetes mellitus without complications: Secondary | ICD-10-CM | POA: Insufficient documentation

## 2015-12-25 DIAGNOSIS — R071 Chest pain on breathing: Secondary | ICD-10-CM | POA: Insufficient documentation

## 2015-12-25 DIAGNOSIS — I10 Essential (primary) hypertension: Secondary | ICD-10-CM | POA: Insufficient documentation

## 2015-12-25 DIAGNOSIS — J45901 Unspecified asthma with (acute) exacerbation: Secondary | ICD-10-CM | POA: Insufficient documentation

## 2015-12-25 DIAGNOSIS — F329 Major depressive disorder, single episode, unspecified: Secondary | ICD-10-CM | POA: Diagnosis not present

## 2015-12-25 DIAGNOSIS — F419 Anxiety disorder, unspecified: Secondary | ICD-10-CM | POA: Insufficient documentation

## 2015-12-25 DIAGNOSIS — F909 Attention-deficit hyperactivity disorder, unspecified type: Secondary | ICD-10-CM | POA: Diagnosis not present

## 2015-12-25 DIAGNOSIS — J069 Acute upper respiratory infection, unspecified: Secondary | ICD-10-CM | POA: Diagnosis not present

## 2015-12-25 DIAGNOSIS — J029 Acute pharyngitis, unspecified: Secondary | ICD-10-CM | POA: Diagnosis present

## 2015-12-25 NOTE — ED Notes (Signed)
MD at bedside. 

## 2015-12-25 NOTE — ED Provider Notes (Signed)
CSN: [id number]     Arrival date & time 12/25/15  0720 History   First MD Initiated Contact with Patient 12/25/15 [phone]     Chief Complaint  Patient presents with  . Sore Throat     Patient is a 45 y.o. female presenting with pharyngitis. The history is provided by the patient.  Sore Throat Associated symptoms include chest pain. Pertinent negatives include no abdominal pain and no shortness of breath.  Patient presents with head congestion and cough. Has had some green sputum production. She also lost her voice. Began around 5 days ago. No fevers. Sputum was initially bubbly and now is green. No nausea vomiting diarrhea. Does have a mild sore throat. No difficult breathing. She does have some mild chest pain with breathing. No sick contacts. Does have a history of asthma.  Past Medical History  Diagnosis Date  . Migraine     migraine is aura presenting before seizure.  . Seizure ([place])     stress induced; has migraine aura  . Allergy   . Depression   . History of laparoscopic cholecystectomy 1991  . H/O tubal ligation 05/15/2003  . Hypertension   . Asthma     allergy related..  . Type II or unspecified type diabetes mellitus without mention of complication, not stated as uncontrolled 12/26/2013  . Anxiety   . ADD (attention deficit disorder) 06/11/2015   Past Surgical History  Procedure Laterality Date  . Tubal ligation    . Laparoscopic cholecystectomy    . Induced abortion    . Cesarean section      x2  . Dilitation & currettage/hystroscopy with novasure ablation N/A 11/29/2013    Procedure: DILATATION & CURETTAGE/HYSTEROSCOPY WITH  RESECTION;  Surgeon: Ena Dawley, MD;  Location: [facility];  Service: Gynecology;  Laterality: N/A;   Family History  Problem Relation Age of Onset  . Diabetes Mother   . Heart disease Mother   . Diabetes Father   . Cancer Father     THYROID  . Hypertension Father   . Hyperlipidemia Father   . Mental illness Brother     PSTD,DEPRESSION  .  Migraines Son   . Allergy (severe) Son   . Diabetes Maternal Aunt   . Goiter Maternal Uncle   . Diabetes Paternal Aunt   . Alcohol abuse Paternal Uncle     ALCOHOL AND DRUGS  . Heart disease Maternal Grandmother   . Diabetes Maternal Grandmother   . Hypertension Maternal Grandmother   . Diabetes Maternal Grandfather   . Heart disease Maternal Grandfather   . Cancer Maternal Grandfather     QUESTION WHAT KIND  . Heart disease Paternal Grandmother   . Diabetes Paternal Grandmother   . Hypertension Paternal Grandmother   . Cancer Paternal Grandmother     BREAST  . Migraines Son   . Allergy (severe) Son   . Heart disease Maternal Aunt   . Diabetes Paternal Aunt   . Arthritis Paternal Aunt    Social History  Substance Use Topics  . Smoking status: Never Smoker   . Smokeless tobacco: Never Used  . Alcohol Use: 0.0 - 0.5 oz/week    0-1 drink(s) per week     Comment: socially/holidays   OB History    Gravida Para Term Preterm AB TAB SAB Ectopic Multiple Living   3 2 2  0 1 0 0 0 0 2     Review of Systems  Constitutional: Negative for appetite change.  HENT: Positive for postnasal  drip, sore throat and voice change. Negative for facial swelling, sinus pressure and trouble swallowing.   Respiratory: Positive for cough. Negative for shortness of breath.   Cardiovascular: Positive for chest pain.  Gastrointestinal: Negative for abdominal pain.  Genitourinary: Negative for flank pain.  Skin: Negative for wound.      Allergies  Review of patient's allergies indicates no known allergies.  Home Medications   Prior to Admission medications   Medication Sig Start Date End Date Taking? Authorizing Provider  amphetamine-dextroamphetamine (ADDERALL) 20 MG tablet Take 20 mg by mouth 2 (two) times daily.    Historical Provider, MD  citalopram (CELEXA) 40 MG tablet Take 40 mg by mouth daily.    Historical Provider, MD  cloNIDine (CATAPRES) 0.2 MG tablet Take 0.2 mg by mouth 2 (two)  times daily.    Historical Provider, MD  ferrous sulfate 325 (65 FE) MG tablet Take 325 mg by mouth 2 (two) times daily with a meal. Reported on 10/28/2015 10/18/13   Delfina Redwood, MD  VENTOLIN HFA 108 (90 BASE) MCG/ACT inhaler INHALE 2 PUFFS INTO THE LUNGS EVERY 4 (FOUR) HOURS AS NEEDED. FOR SHORTNESS OF BREATH    Barton Fanny, MD   BP 111/80 mmHg  Pulse 64  Temp(Src) 98.1 F (36.7 C) (Oral)  Resp 18  SpO2 97%  LMP 12/08/2015 Physical Exam  Constitutional: She appears well-developed.  HENT:  Quiet voice. Mild posterior pharyngeal erythema without exudate.   Eyes: Pupils are equal, round, and reactive to light.  Neck: Neck supple.  Cardiovascular: Normal rate.   Pulmonary/Chest: Effort normal.  Wheezes to right upper lung field. No respiratory distress.   Abdominal: Soft.  Musculoskeletal: Normal range of motion.  Neurological: She is alert.  Skin: Skin is warm.    ED Course  Procedures (including critical care time) Labs Review Labs Reviewed - No data to display  Imaging Review Dg Chest 2 View  12/25/2015  CLINICAL DATA:  Sore throat and cough for 3 days EXAM: CHEST  2 VIEW COMPARISON:  10/17/2013 FINDINGS: Normal heart size. Lungs clear. No pneumothorax. No pleural effusion. IMPRESSION: No active cardiopulmonary disease. Electronically Signed   By: Marybelle Killings M.D.   On: 12/25/2015 08:10   I have personally reviewed and evaluated these images and lab results as part of my medical decision-making.   EKG Interpretation None      MDM   Final diagnoses:  URI (upper respiratory infection)    Patient with cough and difficulty speaking. Likely pharyngitis/viral infection. Did have few wheezes but had negative x-ray for pneumonia. Discussed with patient's that steroids may help some of his symptoms but she did not want them. Will discharge home.    Davonna Belling, MD 12/25/15 [phone]

## 2015-12-25 NOTE — ED Notes (Signed)
Sore throat, cough, ear pain since Tuesday.  Pt has lost her voice.  Pt productive green sputum.  Some fever on Tuesday.

## 2015-12-25 NOTE — Discharge Instructions (Signed)
Upper Respiratory Infection, Adult Most upper respiratory infections (URIs) are a viral infection of the air passages leading to the lungs. A URI affects the nose, throat, and upper air passages. The most common type of URI is nasopharyngitis and is typically referred to as "the common cold." URIs run their course and usually go away on their own. Most of the time, a URI does not require medical attention, but sometimes a bacterial infection in the upper airways can follow a viral infection. This is called a secondary infection. Sinus and middle ear infections are common types of secondary upper respiratory infections. Bacterial pneumonia can also complicate a URI. A URI can worsen asthma and chronic obstructive pulmonary disease (COPD). Sometimes, these complications can require emergency medical care and may be life threatening.  CAUSES Almost all URIs are caused by viruses. A virus is a type of germ and can spread from one person to another.  RISKS FACTORS You may be at risk for a URI if:   You smoke.   You have chronic heart or lung disease.  You have a weakened defense (immune) system.   You are very young or very old.   You have nasal allergies or asthma.  You work in crowded or poorly ventilated areas.  You work in health care facilities or schools. SIGNS AND SYMPTOMS  Symptoms typically develop 2-3 days after you come in contact with a cold virus. Most viral URIs last 7-10 days. However, viral URIs from the influenza virus (flu virus) can last 14-18 days and are typically more severe. Symptoms may include:   Runny or stuffy (congested) nose.   Sneezing.   Cough.   Sore throat.   Headache.   Fatigue.   Fever.   Loss of appetite.   Pain in your forehead, behind your eyes, and over your cheekbones (sinus pain).  Muscle aches.  DIAGNOSIS  Your health care provider may diagnose a URI by:  Physical exam.  Tests to check that your symptoms are not due to  another condition such as:  Strep throat.  Sinusitis.  Pneumonia.  Asthma. TREATMENT  A URI goes away on its own with time. It cannot be cured with medicines, but medicines may be prescribed or recommended to relieve symptoms. Medicines may help:  Reduce your fever.  Reduce your cough.  Relieve nasal congestion. HOME CARE INSTRUCTIONS   Take medicines only as directed by your health care provider.   Gargle warm saltwater or take cough drops to comfort your throat as directed by your health care provider.  Use a warm mist humidifier or inhale steam from a shower to increase air moisture. This may make it easier to breathe.  Drink enough fluid to keep your urine clear or pale yellow.   Eat soups and other clear broths and maintain good nutrition.   Rest as needed.   Return to work when your temperature has returned to normal or as your health care provider advises. You may need to stay home longer to avoid infecting others. You can also use a face mask and careful hand washing to prevent spread of the virus.  Increase the usage of your inhaler if you have asthma.   Do not use any tobacco products, including cigarettes, chewing tobacco, or electronic cigarettes. If you need help quitting, ask your health care provider. PREVENTION  The best way to protect yourself from getting a cold is to practice good hygiene.   Avoid oral or hand contact with people with cold   symptoms.   Wash your hands often if contact occurs.  There is no clear evidence that vitamin C, vitamin E, echinacea, or exercise reduces the chance of developing a cold. However, it is always recommended to get plenty of rest, exercise, and practice good nutrition.  SEEK MEDICAL CARE IF:   You are getting worse rather than better.   Your symptoms are not controlled by medicine.   You have chills.  You have worsening shortness of breath.  You have J[place]oh or red mucus.  You have yellow or Mcandrew nasal  discharge.  You have pain in your face, especially when you bend forward.  You have a fever.  You have swollen neck glands.  You have pain while swallowing.  You have white areas in the back of your throat. SEEK IMMEDIATE MEDICAL CARE IF:   You have severe or persistent:  Headache.  Ear pain.  Sinus pain.  Chest pain.  You have chronic lung disease and any of the following:  Wheezing.  Prolonged cough.  Coughing up blood.  A change in your usual mucus.  You have a stiff neck.  You have changes in your:  Vision.  Hearing.  Thinking.  Mood. MAKE SURE YOU:   Understand these instructions.  Will watch your condition.  Will get help right away if you are not doing well or get worse.   This information is not intended to replace advice given to you by your health care provider. Make sure you discuss any questions you have with your health care provider.   Document Released: 03/22/2001 Document Revised: 02/10/2015 Document Reviewed: 01/01/2014 Elsevier Interactive Patient Education 2016 [employer]  

## 2016-02-05 ENCOUNTER — Emergency Department (HOSPITAL_BASED_OUTPATIENT_CLINIC_OR_DEPARTMENT_OTHER)
Admission: EM | Admit: 2016-02-05 | Discharge: 2016-02-06 | Disposition: A | Discharge status: Home / Self Care | Payer: Managed Care, Other (non HMO) | Attending: Emergency Medicine | Admitting: Emergency Medicine

## 2016-02-05 ENCOUNTER — Emergency Department (HOSPITAL_BASED_OUTPATIENT_CLINIC_OR_DEPARTMENT_OTHER): Payer: Managed Care, Other (non HMO)

## 2016-02-05 ENCOUNTER — Encounter (HOSPITAL_BASED_OUTPATIENT_CLINIC_OR_DEPARTMENT_OTHER): Payer: Self-pay | Admitting: *Deleted

## 2016-02-05 DIAGNOSIS — E119 Type 2 diabetes mellitus without complications: Secondary | ICD-10-CM | POA: Diagnosis not present

## 2016-02-05 DIAGNOSIS — J45901 Unspecified asthma with (acute) exacerbation: Secondary | ICD-10-CM | POA: Insufficient documentation

## 2016-02-05 DIAGNOSIS — H672 Otitis media in diseases classified elsewhere, left ear: Secondary | ICD-10-CM

## 2016-02-05 DIAGNOSIS — I10 Essential (primary) hypertension: Secondary | ICD-10-CM | POA: Insufficient documentation

## 2016-02-05 DIAGNOSIS — H6690 Otitis media, unspecified, unspecified ear: Secondary | ICD-10-CM | POA: Insufficient documentation

## 2016-02-05 DIAGNOSIS — J45909 Unspecified asthma, uncomplicated: Secondary | ICD-10-CM | POA: Diagnosis present

## 2016-02-05 DIAGNOSIS — F329 Major depressive disorder, single episode, unspecified: Secondary | ICD-10-CM | POA: Insufficient documentation

## 2016-02-05 DIAGNOSIS — J989 Respiratory disorder, unspecified: Secondary | ICD-10-CM

## 2016-02-05 MED ORDER — DEXAMETHASONE SODIUM PHOSPHATE 10 MG/ML IJ SOLN
10.0000 mg | Freq: Once | INTRAMUSCULAR | Status: AC
  Administered 2016-02-05: 10 mg via INTRAMUSCULAR
  Filled 2016-02-05: qty 1

## 2016-02-05 MED ORDER — AMOXICILLIN 500 MG PO CAPS
1000.0000 mg | ORAL_CAPSULE | Freq: Once | ORAL | Status: AC
  Administered 2016-02-05: 1000 mg via ORAL
  Filled 2016-02-05: qty 2

## 2016-02-05 MED ORDER — AMOXICILLIN 500 MG PO CAPS: 1000.0000 mg | ORAL_CAPSULE | Freq: Three times a day (TID) | ORAL | Status: DC

## 2016-02-05 MED ORDER — ALBUTEROL SULFATE HFA 108 (90 BASE) MCG/ACT IN AERS
2.0000 | INHALATION_SPRAY | RESPIRATORY_TRACT | Status: DC | PRN
  Administered 2016-02-05: 2 via RESPIRATORY_TRACT
  Filled 2016-02-05: qty 6.7

## 2016-02-05 NOTE — Progress Notes (Signed)
Patient stated that she had 2 nebulizer treatments approximately 2 hours ago and 3 puffs of a ventolin inhaler just prior to her arrival at the ED.

## 2016-02-05 NOTE — ED Notes (Signed)
Pt reports asthma attack that started tonight.  Cough noted.  Reports 2 breathing treatments, 3 puff on albuterol inhaler PTA.  No wheezing noted.  Pt speaking in sentences.  Does appear to have labored breathing.

## 2016-02-05 NOTE — ED Provider Notes (Signed)
CSN: [id number]     Arrival date & time 02/05/16  2146 History   By signing my name below, I, Forrestine Him, attest that this documentation has been prepared under the direction and in the presence of Shanon Rosser, MD.  Electronically Signed: Forrestine Him, ED Scribe. 02/05/2016. 11:18 PM.    Chief Complaint  Patient presents with  . Asthma   HPI  HPI Comments: Holly Hopkins is a 45 y.o. female with a PMHx of HTN and asthma who presents to the Emergency Department complaining of shortness of breath x 4 days that acutely worsened this evening. Pt also reports ongoing productive cough consisting on green/yellow mucus, bilateral ear pain left worse than right, and hoarse voice. She took two breathing treatments at home followed by 3 puffs of her mother's albuterol inhaler prior to arrival with significant improvement. Respiratory therapist noticed patient lungs to be clear on arrival with good peak flow. No recent fever or chills.   PCP: Eli Hose, MD    Past Medical History  Diagnosis Date  . Migraine     migraine is aura presenting before seizure.  . Seizure ([place])     stress induced; has migraine aura  . Allergy   . Depression   . History of laparoscopic cholecystectomy 1991  . H/O tubal ligation 05/15/2003  . Hypertension   . Asthma     allergy related..  . Type II or unspecified type diabetes mellitus without mention of complication, not stated as uncontrolled 12/26/2013  . Anxiety   . ADD (attention deficit disorder) 06/11/2015   Past Surgical History  Procedure Laterality Date  . Tubal ligation    . Laparoscopic cholecystectomy    . Induced abortion    . Cesarean section      x2  . Dilitation & currettage/hystroscopy with novasure ablation N/A 11/29/2013    Procedure: DILATATION & CURETTAGE/HYSTEROSCOPY WITH  RESECTION;  Surgeon: Ena Dawley, MD;  Location: [facility];  Service: Gynecology;  Laterality: N/A;   Family History  Problem Relation Age of Onset  .  Diabetes Mother   . Heart disease Mother   . Diabetes Father   . Cancer Father     THYROID  . Hypertension Father   . Hyperlipidemia Father   . Mental illness Brother     PSTD,DEPRESSION  . Migraines Son   . Allergy (severe) Son   . Diabetes Maternal Aunt   . Goiter Maternal Uncle   . Diabetes Paternal Aunt   . Alcohol abuse Paternal Uncle     ALCOHOL AND DRUGS  . Heart disease Maternal Grandmother   . Diabetes Maternal Grandmother   . Hypertension Maternal Grandmother   . Diabetes Maternal Grandfather   . Heart disease Maternal Grandfather   . Cancer Maternal Grandfather     QUESTION WHAT KIND  . Heart disease Paternal Grandmother   . Diabetes Paternal Grandmother   . Hypertension Paternal Grandmother   . Cancer Paternal Grandmother     BREAST  . Migraines Son   . Allergy (severe) Son   . Heart disease Maternal Aunt   . Diabetes Paternal Aunt   . Arthritis Paternal Aunt    Social History  Substance Use Topics  . Smoking status: Never Smoker   . Smokeless tobacco: Never Used  . Alcohol Use: 0.0 - 0.5 oz/week    0-1 drink(s) per week     Comment: socially/holidays   OB History    Gravida Para Term Preterm AB TAB SAB Ectopic Multiple  Living   3 2 2  0 1 0 0 0 0 2     Review of Systems  All other systems reviewed and are negative.   Allergies  Review of patient's allergies indicates no known allergies.  Home Medications   Prior to Admission medications   Medication Sig Start Date End Date Taking? Authorizing Provider  amoxicillin (AMOXIL) 500 MG capsule Take 2 capsules (1,000 mg total) by mouth 3 (three) times daily. 02/05/16   Linlee Cromie, MD  amphetamine-dextroamphetamine (ADDERALL) 20 MG tablet Take 20 mg by mouth 2 (two) times daily.    Historical Provider, MD  citalopram (CELEXA) 40 MG tablet Take 40 mg by mouth daily.    Historical Provider, MD  cloNIDine (CATAPRES) 0.2 MG tablet Take 0.2 mg by mouth 2 (two) times daily.    Historical Provider, MD   ferrous sulfate 325 (65 FE) MG tablet Take 325 mg by mouth 2 (two) times daily with a meal. Reported on 10/28/2015 10/18/13   Delfina Redwood, MD  VENTOLIN HFA 108 (90 BASE) MCG/ACT inhaler INHALE 2 PUFFS INTO THE LUNGS EVERY 4 (FOUR) HOURS AS NEEDED. FOR SHORTNESS OF BREATH    Barton Fanny, MD   Triage Vitals: Pulse 102  Temp(Src) 99 F (37.2 C) (Oral)  Resp 22  Ht 5\' 4"  (1.626 m)  Wt 220 lb (99.791 kg)  BMI 37.74 kg/m2  SpO2 99%  LMP 02/05/2016   Physical Exam  General: Well-developed, well-nourished female in no acute distress; appearance consistent with age of record HENT: normocephalic; atraumatic; R TM erythematous. L TM normal; Dysphonia Eyes: pupils equal, round and reactive to light; extraocular muscles intact Neck: supple Heart: regular rate and rhythm Lungs: clear to auscultation bilaterally Abdomen: soft; nondistended; nontender; bowel sounds present Extremities: No deformity; full range of motion; pulses normal Neurologic: Awake, alert and oriented; motor function intact in all extremities and symmetric; no facial droop Skin: Warm and dry Psychiatric: Normal mood and affect   ED Course  Procedures (including critical care time)  DIAGNOSTIC STUDIES: Oxygen Saturation is 99% on RA, Normal by my interpretation.     MDM  Nursing notes and vitals signs, including pulse oximetry, reviewed.  Summary of this visit's results, reviewed by myself:  Imaging Studies: Dg Chest 2 View  02/05/2016  CLINICAL DATA:  45 year old female with history of asthma attack. Cough. Labored breathing. EXAM: CHEST  2 VIEW COMPARISON:  Chest x-ray 12/25/2015. FINDINGS: Mild elevation of the right hemidiaphragm. Lung volumes are normal. No consolidative airspace disease. No pleural effusions. No pneumothorax. No pulmonary nodule or mass noted. Pulmonary vasculature and the cardiomediastinal silhouette are within normal limits. Surgical clips project over the right upper quadrant of the  abdomen, likely from prior cholecystectomy. IMPRESSION: 1. No radiographic evidence of acute cardiopulmonary disease. 2. Mild elevation of the right hemidiaphragm. Electronically Signed   By: Vinnie Langton M.D.   On: 02/05/2016 22:19     Final diagnoses:  Asthma exacerbation  Otitis media in diseases classified elsewhere, left ear  Respiratory illness   I personally performed the services described in this documentation, which was scribed in my presence. The recorded information has been reviewed and is accurate.   Shanon Rosser, MD 02/05/16 2329

## 2016-02-05 NOTE — Discharge Instructions (Signed)
Asthma, Adult Asthma is a recurring condition in which the airways tighten and narrow. Asthma can make it difficult to breathe. It can cause coughing, wheezing, and shortness of breath. Asthma episodes, also called asthma attacks, range from minor to life-threatening. Asthma cannot be cured, but medicines and lifestyle changes can help control it. CAUSES Asthma is believed to be caused by inherited (genetic) and environmental factors, but its exact cause is unknown. Asthma may be triggered by allergens, lung infections, or irritants in the air. Asthma triggers are different for each person. Common triggers include:   Animal dander.  Dust mites.  Cockroaches.  Pollen from trees or grass.  Mold.  Smoke.  Air pollutants such as dust, household cleaners, hair sprays, aerosol sprays, paint fumes, strong chemicals, or strong odors.  Cold air, weather changes, and winds (which increase molds and pollens in the air).  Strong emotional expressions such as crying or laughing hard.  Stress.  Certain medicines (such as aspirin) or types of drugs (such as beta-blockers).  Sulfites in foods and drinks. Foods and drinks that may contain sulfites include dried fruit, potato chips, and sparkling grape juice.  Infections or inflammatory conditions such as the flu, a cold, or an inflammation of the nasal membranes (rhinitis).  Gastroesophageal reflux disease (GERD).  Exercise or strenuous activity. SYMPTOMS Symptoms may occur immediately after asthma is triggered or many hours later. Symptoms include:  Wheezing.  Excessive nighttime or early morning coughing.  Frequent or severe coughing with a common cold.  Chest tightness.  Shortness of breath. DIAGNOSIS  The diagnosis of asthma is made by a review of your medical history and a physical exam. Tests may also be performed. These may include:  Lung function studies. These tests show how much air you breathe in and out.  Allergy  tests.  Imaging tests such as X-rays. TREATMENT  Asthma cannot be cured, but it can usually be controlled. Treatment involves identifying and avoiding your asthma triggers. It also involves medicines. There are 2 classes of medicine used for asthma treatment:   Controller medicines. These prevent asthma symptoms from occurring. They are usually taken every day.  Reliever or rescue medicines. These quickly relieve asthma symptoms. They are used as needed and provide short-term relief. Your health care provider will help you create an asthma action plan. An asthma action plan is a written plan for managing and treating your asthma attacks. It includes a list of your asthma triggers and how they may be avoided. It also includes information on when medicines should be taken and when their dosage should be changed. An action plan may also involve the use of a device called a peak flow meter. A peak flow meter measures how well the lungs are working. It helps you monitor your condition. HOME CARE INSTRUCTIONS   Take medicines only as directed by your health care provider. Speak with your health care provider if you have questions about how or when to take the medicines.  Use a peak flow meter as directed by your health care provider. Record and keep track of readings.  Understand and use the action plan to help minimize or stop an asthma attack without needing to seek medical care.  Control your home environment in the following ways to help prevent asthma attacks:  Do not smoke. Avoid being exposed to secondhand smoke.  Change your heating and air conditioning filter regularly.  Limit your use of fireplaces and wood stoves.  Get rid of pests (such as roaches   and mice) and their droppings.  Throw away plants if you see mold on them.  Clean your floors and dust regularly. Use unscented cleaning products.  Try to have someone else vacuum for you regularly. Stay out of rooms while they are  being vacuumed and for a short while afterward. If you vacuum, use a dust mask from a hardware store, a double-layered or microfilter vacuum cleaner bag, or a vacuum cleaner with a HEPA filter.  Replace carpet with wood, tile, or vinyl flooring. Carpet can trap dander and dust.  Use allergy-proof pillows, mattress covers, and box spring covers.  Wash bed sheets and blankets every week in hot water and dry them in a dryer.  Use blankets that are made of polyester or cotton.  Clean bathrooms and kitchens with bleach. If possible, have someone repaint the walls in these rooms with mold-resistant paint. Keep out of the rooms that are being cleaned and painted.  Wash hands frequently. SEEK MEDICAL CARE IF:   You have wheezing, shortness of breath, or a cough even if taking medicine to prevent attacks.  The colored mucus you cough up (sputum) is thicker than usual.  Your sputum changes from clear or white to yellow, green, gray, or bloody.  You have any problems that may be related to the medicines you are taking (such as a rash, itching, swelling, or trouble breathing).  You are using a reliever medicine more than 2-3 times per week.  Your peak flow is still at 50-79% of your personal best after following your action plan for 1 hour.  You have a fever. SEEK IMMEDIATE MEDICAL CARE IF:   You seem to be getting worse and are unresponsive to treatment during an asthma attack.  You are short of breath even at rest.  You get short of breath when doing very little physical activity.  You have difficulty eating, drinking, or talking due to asthma symptoms.  You develop chest pain.  You develop a fast heartbeat.  You have a bluish color to your lips or fingernails.  You are light-headed, dizzy, or faint.  Your peak flow is less than 50% of your personal best.   This information is not intended to replace advice given to you by your health care provider. Make sure you discuss any  questions you have with your health care provider.   Document Released: 09/26/2005 Document Revised: 06/17/2015 Document Reviewed: 04/25/2013 Elsevier Interactive Patient Education 2016 [employer]  

## 2016-03-17 ENCOUNTER — Emergency Department (HOSPITAL_BASED_OUTPATIENT_CLINIC_OR_DEPARTMENT_OTHER)
Admission: EM | Admit: 2016-03-17 | Discharge: 2016-03-17 | Disposition: A | Discharge status: Home / Self Care | Payer: Managed Care, Other (non HMO) | Attending: Emergency Medicine | Admitting: Emergency Medicine

## 2016-03-17 ENCOUNTER — Encounter (HOSPITAL_BASED_OUTPATIENT_CLINIC_OR_DEPARTMENT_OTHER): Payer: Self-pay | Admitting: *Deleted

## 2016-03-17 DIAGNOSIS — M25512 Pain in left shoulder: Secondary | ICD-10-CM | POA: Diagnosis present

## 2016-03-17 DIAGNOSIS — E119 Type 2 diabetes mellitus without complications: Secondary | ICD-10-CM | POA: Insufficient documentation

## 2016-03-17 DIAGNOSIS — M7592 Shoulder lesion, unspecified, left shoulder: Secondary | ICD-10-CM | POA: Diagnosis not present

## 2016-03-17 DIAGNOSIS — I10 Essential (primary) hypertension: Secondary | ICD-10-CM | POA: Insufficient documentation

## 2016-03-17 DIAGNOSIS — J45909 Unspecified asthma, uncomplicated: Secondary | ICD-10-CM | POA: Diagnosis not present

## 2016-03-17 DIAGNOSIS — F329 Major depressive disorder, single episode, unspecified: Secondary | ICD-10-CM | POA: Diagnosis not present

## 2016-03-17 DIAGNOSIS — Z79899 Other long term (current) drug therapy: Secondary | ICD-10-CM | POA: Insufficient documentation

## 2016-03-17 DIAGNOSIS — G5602 Carpal tunnel syndrome, left upper limb: Secondary | ICD-10-CM

## 2016-03-17 DIAGNOSIS — M7582 Other shoulder lesions, left shoulder: Secondary | ICD-10-CM

## 2016-03-17 MED ORDER — NAPROXEN 500 MG PO TABS: 500.0000 mg | ORAL_TABLET | Freq: Two times a day (BID) | ORAL | Status: AC

## 2016-03-17 NOTE — ED Notes (Signed)
Pt reports fall on May 21 (slipped on wet floor and landed on right arm). C/o continued pain in right shoulder and has a knot on her right wrist that is tender to touch. States she woke last night with tingling in right arm

## 2016-03-17 NOTE — ED Notes (Signed)
Directed to pharmacy to pick up prescriptions. Ice pack given for home use

## 2016-03-17 NOTE — ED Provider Notes (Signed)
CSN: [id number]     Arrival date & time 03/17/16  [id number] History   First MD Initiated Contact with Patient 03/17/16 1012     Chief Complaint  Patient presents with  . Arm Pain     (Consider location/radiation/quality/duration/timing/severity/associated sxs/prior Treatment) Patient is a 45 y.o. female presenting with shoulder pain. The history is provided by the patient.  Shoulder Pain Location:  Shoulder Time since incident:  1 day Injury: no   Shoulder location:  L shoulder Pain details:    Quality:  Aching   Radiates to:  Does not radiate   Severity:  Mild   Onset quality:  Gradual   Duration:  1 day   Timing:  Constant (since overusing arm yesterday)   Progression:  Unchanged Chronicity:  New Associated symptoms: tingling (in fingers with wrist pain on passive ROM)     Past Medical History  Diagnosis Date  . Migraine     migraine is aura presenting before seizure.  . Allergy   . Depression   . History of laparoscopic cholecystectomy 1991  . H/O tubal ligation 05/15/2003  . Hypertension   . Asthma     allergy related..  . Type II or unspecified type diabetes mellitus without mention of complication, not stated as uncontrolled 12/26/2013  . Anxiety   . ADD (attention deficit disorder) 06/11/2015  . 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 Tubal ligation    . Laparoscopic cholecystectomy    . Induced abortion    . Cesarean section      x2  . Dilitation & currettage/hystroscopy with novasure ablation N/A 11/29/2013    Procedure: DILATATION & CURETTAGE/HYSTEROSCOPY WITH  RESECTION;  Surgeon: Ena Dawley, MD;  Location: [facility];  Service: Gynecology;  Laterality: N/A;   Family History  Problem Relation Age of Onset  . Diabetes Mother   . Heart disease Mother   . Diabetes Father   . Cancer Father     THYROID  . Hypertension Father   . Hyperlipidemia Father   . Mental illness  Brother     PSTD,DEPRESSION  . Migraines Son   . Allergy (severe) Son   . Diabetes Maternal Aunt   . Goiter Maternal Uncle   . Diabetes Paternal Aunt   . Alcohol abuse Paternal Uncle     ALCOHOL AND DRUGS  . Heart disease Maternal Grandmother   . Diabetes Maternal Grandmother   . Hypertension Maternal Grandmother   . Diabetes Maternal Grandfather   . Heart disease Maternal Grandfather   . Cancer Maternal Grandfather     QUESTION WHAT KIND  . Heart disease Paternal Grandmother   . Diabetes Paternal Grandmother   . Hypertension Paternal Grandmother   . Cancer Paternal Grandmother     BREAST  . Migraines Son   . Allergy (severe) Son   . Heart disease Maternal Aunt   . Diabetes Paternal Aunt   . Arthritis Paternal Aunt    Social History  Substance Use Topics  . Smoking status: Never Smoker   . Smokeless tobacco: Never Used  . Alcohol Use: 0.0 - 0.5 oz/week    0-1 Standard drinks or equivalent per week     Comment: socially/holidays   OB History    Gravida Para Term Preterm AB TAB SAB Ectopic Multiple Living   3 2 2  0 1 0 0 0 0 2     Review  of Systems  All other systems reviewed and are negative.     Allergies  Review of patient's allergies indicates no known allergies.  Home Medications   Prior to Admission medications   Medication Sig Start Date End Date Taking? Authorizing Provider  amphetamine-dextroamphetamine (ADDERALL) 20 MG tablet Take 20 mg by mouth 2 (two) times daily.   Yes Historical Provider, MD  citalopram (CELEXA) 40 MG tablet Take 40 mg by mouth daily.   Yes Historical Provider, MD  cloNIDine (CATAPRES) 0.2 MG tablet Take 0.2 mg by mouth 2 (two) times daily.   Yes Historical Provider, MD  ferrous sulfate 325 (65 FE) MG tablet Take 325 mg by mouth 2 (two) times daily with a meal. Reported on 10/28/2015 10/18/13  Yes Delfina Redwood, MD  Melatonin 5 MG CAPS Take by mouth.   Yes Historical Provider, MD  Multiple Vitamin (MULTIVITAMIN) capsule Take 1  capsule by mouth daily.   Yes Historical Provider, MD  VENTOLIN HFA 108 (90 BASE) MCG/ACT inhaler INHALE 2 PUFFS INTO THE LUNGS EVERY 4 (FOUR) HOURS AS NEEDED. FOR SHORTNESS OF BREATH   Yes Barton Fanny, MD  amoxicillin (AMOXIL) 500 MG capsule Take 2 capsules (1,000 mg total) by mouth 3 (three) times daily. 02/05/16   John Molpus, MD   BP 111/86 mmHg  Pulse 80  Temp(Src) 98.7 F (37.1 C) (Oral)  Resp 18  Ht 5' 3.5" (1.613 m)  Wt 222 lb (100.699 kg)  BMI 38.70 kg/m2  SpO2 98%  LMP 03/14/2016 Physical Exam  Constitutional: She is oriented to person, place, and time. She appears well-developed and well-nourished. No distress.  HENT:  Head: Normocephalic.  Eyes: Conjunctivae are normal.  Neck: Neck supple. No tracheal deviation present.  Cardiovascular: Normal rate and regular rhythm.   Pulmonary/Chest: Effort normal. No respiratory distress.  Abdominal: Soft. She exhibits no distension.  Musculoskeletal:  Pain with internal rotation and abduction of shoulder. Positive tinel sign, pain with percussion of carpal tunnel reproducing symptoms. Sensation and ROM in tact throughout  Neurological: She is alert and oriented to person, place, and time.  Skin: Skin is warm and dry.  Psychiatric: She has a normal mood and affect.    ED Course  Procedures (including critical care time) Labs Review Labs Reviewed - No data to display  Imaging Review No results found. I have personally reviewed and evaluated these images and lab results as part of my medical decision-making.   EKG Interpretation None      MDM   Final diagnoses:  Rotator cuff tendonitis, left  Acute carpal tunnel syndrome of left wrist    45 year old female presents after re-aggravating a left shoulder and wrist injury from 2 weeks ago when she fell from standing. She had been in her normal state of health yesterday until she performed multiple past requiring repeated stress of her left shoulder and wrist. She  has a positive Tinel sign and tenderness to percussion with shooting pain over the carpal tunnel which is classic secondary to overuse. Recommended wrist splinting as needed and NSAIDs. She has worsening pain with internal rotation and abduction of the shoulder suggestive of rotator cuff strain but no entrapment and no significant loss of function to suggest a tear. Plan to follow up with PCP as needed and return precautions discussed for worsening or new concerning symptoms.   Leo Grosser, MD 03/17/16 [phone]

## 2016-03-17 NOTE — Discharge Instructions (Signed)
Carpal Tunnel Syndrome Carpal tunnel syndrome is a condition that causes pain in your hand and arm. The carpal tunnel is a narrow area located on the palm side of your wrist. Repeated wrist motion or certain diseases may cause swelling within the tunnel. This swelling pinches the main nerve in the wrist (median nerve). CAUSES  This condition may be caused by:   Repeated wrist motions.  Wrist injuries.  Arthritis.  A cyst or tumor in the carpal tunnel.  Fluid buildup during pregnancy. Sometimes the cause of this condition is not known.  RISK FACTORS This condition is more likely to develop in:   People who have jobs that cause them to repeatedly move their wrists in the same motion, such as butchers and cashiers.  Women.  People with certain conditions, such as:  Diabetes.  Obesity.  An underactive thyroid (hypothyroidism).  Kidney failure. SYMPTOMS  Symptoms of this condition include:   A tingling feeling in your fingers, especially in your thumb, index, and middle fingers.  Tingling or numbness in your hand.  An aching feeling in your entire arm, especially when your wrist and elbow are bent for long periods of time.  Wrist pain that goes up your arm to your shoulder.  Pain that goes down into your palm or fingers.  A weak feeling in your hands. You may have trouble grabbing and holding items. Your symptoms may feel worse during the night.  DIAGNOSIS  This condition is diagnosed with a medical history and physical exam. You may also have tests, including:   An electromyogram (EMG). This test measures electrical signals sent by your nerves into the muscles.  X-rays. TREATMENT  Treatment for this condition includes:  Lifestyle changes. It is important to stop doing or modify the activity that caused your condition.  Physical or occupational therapy.  Medicines for pain and inflammation. This may include medicine that is injected into your wrist.  A wrist  splint.  Surgery. HOME CARE INSTRUCTIONS  If You Have a Splint:  Wear it as told by your health care provider. Remove it only as told by your health care provider.  Loosen the splint if your fingers become numb and tingle, or if they turn cold and blue.  Keep the splint clean and dry. General Instructions  Take over-the-counter and prescription medicines only as told by your health care provider.  Rest your wrist from any activity that may be causing your pain. If your condition is work related, talk to your employer about changes that can be made, such as getting a wrist pad to use while typing.  If directed, apply ice to the painful area:  Put ice in a plastic bag.  Place a towel between your skin and the bag.  Leave the ice on for 20 minutes, 2-3 times per day.  Keep all follow-up visits as told by your health care provider. This is important.  Do any exercises as told by your health care provider, physical therapist, or occupational therapist. [place] IF:   You have new symptoms.  Your pain is not controlled with medicines.  Your symptoms get worse.   This information is not intended to replace advice given to you by your health care provider. Make sure you discuss any questions you have with your health care provider.   Document Released: 09/23/2000 Document Revised: 06/17/2015 Document Reviewed: 02/11/2015 Elsevier Interactive Patient Education 2016 [place] Cuff Tendinitis Rotator cuff tendinitis is inflammation of the tough, cord-like bands  that connect muscle to bone (tendons) in your rotator cuff. Your rotator cuff is the collection of all the muscles and tendons that connect your arm to your shoulder. Your rotator cuff holds the head of your upper arm bone (humerus) in the cup (fossa) of your shoulder blade (scapula). CAUSES Rotator cuff tendinitis is usually caused by overusing the joint involved.  SIGNS AND SYMPTOMS  Deep ache in the  shoulder also felt on the outside upper arm over the shoulder muscle.  Point tenderness over the area that is injured.  Pain comes on gradually and becomes worse with lifting the arm to the side (abduction) or turning it inward (internal rotation).  May lead to a chronic tear: When a rotator cuff tendon becomes inflamed, it runs the risk of losing its blood supply, causing some tendon fibers to die. This increases the risk that the tendon can fray and partially or completely tear. DIAGNOSIS Rotator cuff tendinitis is diagnosed by taking a medical history, performing a physical exam, and reviewing results of imaging exams. The medical history is useful to help determine the type of rotator cuff injury. The physical exam will include looking at the injured shoulder, feeling the injured area, and watching you do range-of-motion exercises. X-ray exams are typically done to rule out other causes of shoulder pain, such as fractures. MRI is the imaging exam usually used for significant shoulder injuries. Sometimes a dye study called CT arthrogram is done, but it is not as widely used as MRI. In some institutions, special ultrasound tests may also be used to aid in the diagnosis. TREATMENT  Less Severe Cases  Use of a sling to rest the shoulder for a short period of time. Prolonged use of the sling can cause stiffness, weakness, and loss of motion of the shoulder joint.  Anti-inflammatory medicines, such as ibuprofen or naproxen sodium, may be prescribed. More Severe Cases  Physical therapy.  Use of steroid injections into the shoulder joint.  Surgery. HOME CARE INSTRUCTIONS   Use a sling or splint until the pain decreases. Prolonged use of the sling can cause stiffness, weakness, and loss of motion of the shoulder joint.  Apply ice to the injured area:  Put ice in a plastic bag.  Place a towel between your skin and the bag.  Leave the ice on for 20 minutes, 2-3 times a day.  Try to avoid  use other than gentle range of motion while your shoulder is painful. Use the shoulder and exercise only as directed by your health care provider. Stop exercises or range of motion if pain or discomfort increases, unless directed otherwise by your health care provider.  Only take over-the-counter or prescription medicines for pain, discomfort, or fever as directed by your health care provider.  If you were given a shoulder sling and straps (immobilizer), do not remove it except as directed, or until you see a health care provider for a follow-up exam. If you need to remove it, move your arm as little as possible or as directed.  You may want to sleep on several pillows at night to lessen swelling and pain. SEEK IMMEDIATE MEDICAL CARE IF:   Your shoulder pain increases or new pain develops in your arm, hand, or fingers and is not relieved with medicines.  You have new, unexplained symptoms, especially increased numbness in the hands or loss of strength.  You develop any worsening of the problems that brought you in for care.  Your arm, hand, or fingers are  numb or tingling.  Your arm, hand, or fingers are swollen, painful, or turn white or blue. MAKE SURE YOU:  Understand these instructions.  Will watch your condition.  Will get help right away if you are not doing well or get worse.   This information is not intended to replace advice given to you by your health care provider. Make sure you discuss any questions you have with your health care provider.   Document Released: 12/17/2003 Document Revised: 10/17/2014 Document Reviewed: 05/08/2013 Elsevier Interactive Patient Education [employer].

## 2016-04-08 ENCOUNTER — Emergency Department (HOSPITAL_BASED_OUTPATIENT_CLINIC_OR_DEPARTMENT_OTHER)
Admission: EM | Admit: 2016-04-08 | Discharge: 2016-04-08 | Disposition: A | Discharge status: Home / Self Care | Payer: Managed Care, Other (non HMO) | Attending: Emergency Medicine | Admitting: Emergency Medicine

## 2016-04-08 ENCOUNTER — Emergency Department (HOSPITAL_BASED_OUTPATIENT_CLINIC_OR_DEPARTMENT_OTHER): Payer: Managed Care, Other (non HMO)

## 2016-04-08 ENCOUNTER — Encounter (HOSPITAL_BASED_OUTPATIENT_CLINIC_OR_DEPARTMENT_OTHER): Payer: Self-pay | Admitting: *Deleted

## 2016-04-08 DIAGNOSIS — E119 Type 2 diabetes mellitus without complications: Secondary | ICD-10-CM | POA: Diagnosis not present

## 2016-04-08 DIAGNOSIS — J45909 Unspecified asthma, uncomplicated: Secondary | ICD-10-CM | POA: Insufficient documentation

## 2016-04-08 DIAGNOSIS — I10 Essential (primary) hypertension: Secondary | ICD-10-CM | POA: Insufficient documentation

## 2016-04-08 DIAGNOSIS — F909 Attention-deficit hyperactivity disorder, unspecified type: Secondary | ICD-10-CM | POA: Diagnosis not present

## 2016-04-08 DIAGNOSIS — Y929 Unspecified place or not applicable: Secondary | ICD-10-CM | POA: Insufficient documentation

## 2016-04-08 DIAGNOSIS — S66912A Strain of unspecified muscle, fascia and tendon at wrist and hand level, left hand, initial encounter: Secondary | ICD-10-CM | POA: Diagnosis not present

## 2016-04-08 DIAGNOSIS — Y939 Activity, unspecified: Secondary | ICD-10-CM | POA: Diagnosis not present

## 2016-04-08 DIAGNOSIS — Z79899 Other long term (current) drug therapy: Secondary | ICD-10-CM | POA: Diagnosis not present

## 2016-04-08 DIAGNOSIS — Y999 Unspecified external cause status: Secondary | ICD-10-CM | POA: Insufficient documentation

## 2016-04-08 DIAGNOSIS — S46912A Strain of unspecified muscle, fascia and tendon at shoulder and upper arm level, left arm, initial encounter: Secondary | ICD-10-CM | POA: Diagnosis not present

## 2016-04-08 DIAGNOSIS — M5412 Radiculopathy, cervical region: Secondary | ICD-10-CM | POA: Diagnosis not present

## 2016-04-08 DIAGNOSIS — Z8669 Personal history of other diseases of the nervous system and sense organs: Secondary | ICD-10-CM | POA: Insufficient documentation

## 2016-04-08 DIAGNOSIS — F329 Major depressive disorder, single episode, unspecified: Secondary | ICD-10-CM | POA: Diagnosis not present

## 2016-04-08 DIAGNOSIS — S6992XA Unspecified injury of left wrist, hand and finger(s), initial encounter: Secondary | ICD-10-CM | POA: Diagnosis present

## 2016-04-08 DIAGNOSIS — W1839XA Other fall on same level, initial encounter: Secondary | ICD-10-CM | POA: Insufficient documentation

## 2016-04-08 MED ORDER — HYDROCODONE-ACETAMINOPHEN 5-325 MG PO TABS: 1.0000 | ORAL_TABLET | ORAL | Status: DC | PRN

## 2016-04-08 MED ORDER — KETOROLAC TROMETHAMINE 30 MG/ML IJ SOLN
30.0000 mg | Freq: Once | INTRAMUSCULAR | Status: AC
Start: 2016-04-08 — End: 2016-04-08
  Administered 2016-04-08: 30 mg via INTRAMUSCULAR
  Filled 2016-04-08: qty 1

## 2016-04-08 MED ORDER — PREDNISONE 10 MG (21) PO TBPK: 10.0000 mg | ORAL_TABLET | Freq: Every day | ORAL | Status: DC

## 2016-04-08 MED ORDER — DEXAMETHASONE SODIUM PHOSPHATE 10 MG/ML IJ SOLN
10.0000 mg | Freq: Once | INTRAMUSCULAR | Status: AC
  Administered 2016-04-08: 10 mg via INTRAMUSCULAR
  Filled 2016-04-08: qty 1

## 2016-04-08 NOTE — Discharge Instructions (Signed)
Cervical Radiculopathy Cervical radiculopathy means that a nerve in the neck is pinched or bruised. This can cause pain or loss of feeling (numbness) that runs from your neck to your arm and fingers. HOME CARE Managing Pain  Take over-the-counter and prescription medicines only as told by your doctor.  If directed, put ice on the injured or painful area.  Put ice in a plastic bag.  Place a towel between your skin and the bag.  Leave the ice on for 20 minutes, 2-3 times per day.  If ice does not help, you can try using heat. Take a warm shower or warm bath, or use a heat pack as told by your doctor.  You may try a gentle neck and shoulder massage. Activity  Rest as needed. Follow instructions from your doctor about any activities to avoid.  Do exercises as told by your doctor or physical therapist. General Instructions   If you were given a soft collar, wear it as told by your doctor.  Use a flat pillow when you sleep.  Keep all follow-up visits as told by your doctor. This is important. GET HELP IF:  Your condition does not improve with treatment. GET HELP RIGHT AWAY IF:   Your pain gets worse and is not controlled with medicine.  You lose feeling or feel weak in your hand, arm, face, or leg.  You have a fever.  You have a stiff neck.  You cannot control when you poop or pee (have incontinence).  You have trouble with walking, balance, or talking.   This information is not intended to replace advice given to you by your health care provider. Make sure you discuss any questions you have with your health care provider.   Document Released: 09/15/2011 Document Revised: 06/17/2015 Document Reviewed: 11/20/2014 Elsevier Interactive Patient Education 2016 [employer]vier Inc[employer]  

## 2016-04-08 NOTE — ED Provider Notes (Signed)
CSN: [id number]     Arrival date & time 04/08/16  1946 History  By signing my name below, I, Higinio Plan, attest that this documentation has been prepared under the direction and in the presence of Isla Pence, MD . Electronically Signed: Higinio Plan, Scribe. 04/08/2016. 8:38 PM.   Chief Complaint  Patient presents with  . Wrist Pain  . Shoulder Pain   The history is provided by the patient. No language interpreter was used.   HPI Comments: Holly Hopkins is a 45 y.o. female with PMHx of HTN and DM, who presents to the Emergency Department complaining of gradually worsening, left wrist and shoulder pain s/p a fall that occurred on 02/28/16. Pt states she fell forward and struck her hand and side on a door. She reports visible ecchymosis on her arm at the time of her fall. Pt reports she was seen in the ED on 03/17/16 and did not receive an X-ray but was told she had carpal tunnel and tendonitis in her left wrist. She notes associated symptoms of intermittent numbness in her fingertips on her left hand. Pt states she has used ice to alleviate her symptoms with no relief. She states she did not seek immediate attention for her pain because she believed it would gradually subside. She denies any other complaints.   Past Medical History  Diagnosis Date  . Migraine     migraine is aura presenting before seizure.  . Allergy   . Depression   . History of laparoscopic cholecystectomy 1991  . H/O tubal ligation 05/15/2003  . Hypertension   . Asthma     allergy related..  . Type II or unspecified type diabetes mellitus without mention of complication, not stated as uncontrolled 12/26/2013  . Anxiety   . ADD (attention deficit disorder) 06/11/2015  . Seizure ([place])     stress induced; has migraine aura; last seizure in 2010   Past Surgical History  Procedure Laterality Date  . Tubal ligation    . Laparoscopic cholecystectomy    . Induced abortion    . Cesarean section      x2  . Dilitation &  currettage/hystroscopy with novasure ablation N/A 11/29/2013    Procedure: DILATATION & CURETTAGE/HYSTEROSCOPY WITH  RESECTION;  Surgeon: Ena Dawley, MD;  Location: [facility];  Service: Gynecology;  Laterality: N/A;   Family History  Problem Relation Age of Onset  . Diabetes Mother   . Heart disease Mother   . Diabetes Father   . Cancer Father     THYROID  . Hypertension Father   . Hyperlipidemia Father   . Mental illness Brother     PSTD,DEPRESSION  . Migraines Son   . Allergy (severe) Son   . Diabetes Maternal Aunt   . Goiter Maternal Uncle   . Diabetes Paternal Aunt   . Alcohol abuse Paternal Uncle     ALCOHOL AND DRUGS  . Heart disease Maternal Grandmother   . Diabetes Maternal Grandmother   . Hypertension Maternal Grandmother   . Diabetes Maternal Grandfather   . Heart disease Maternal Grandfather   . Cancer Maternal Grandfather     QUESTION WHAT KIND  . Heart disease Paternal Grandmother   . Diabetes Paternal Grandmother   . Hypertension Paternal Grandmother   . Cancer Paternal Grandmother     BREAST  . Migraines Son   . Allergy (severe) Son   . Heart disease Maternal Aunt   . Diabetes Paternal Aunt   . Arthritis Paternal Aunt  Social History  Substance Use Topics  . Smoking status: Never Smoker   . Smokeless tobacco: Never Used  . Alcohol Use: 0.0 - 0.5 oz/week    0-1 Standard drinks or equivalent per week     Comment: socially/holidays   OB History    Gravida Para Term Preterm AB TAB SAB Ectopic Multiple Living   3 2 2  0 1 0 0 0 0 2     Review of Systems  Musculoskeletal: Positive for arthralgias and neck pain.   Allergies  Review of patient's allergies indicates no known allergies.  Home Medications   Prior to Admission medications   Medication Sig Start Date End Date Taking? Authorizing Provider  amphetamine-dextroamphetamine (ADDERALL) 20 MG tablet Take 20 mg by mouth 2 (two) times daily.   Yes Historical Provider, MD  citalopram (CELEXA)  40 MG tablet Take 40 mg by mouth daily.   Yes Historical Provider, MD  cloNIDine (CATAPRES) 0.2 MG tablet Take 0.2 mg by mouth 2 (two) times daily.   Yes Historical Provider, MD  ferrous sulfate 325 (65 FE) MG tablet Take 325 mg by mouth 2 (two) times daily with a meal. Reported on 10/28/2015 10/18/13  Yes Delfina Redwood, MD  gabapentin (NEURONTIN) 300 MG capsule Take 300 mg by mouth 3 (three) times daily.   Yes Historical Provider, MD  Multiple Vitamin (MULTIVITAMIN) capsule Take 1 capsule by mouth daily.   Yes Historical Provider, MD  VENTOLIN HFA 108 (90 BASE) MCG/ACT inhaler INHALE 2 PUFFS INTO THE LUNGS EVERY 4 (FOUR) HOURS AS NEEDED. FOR SHORTNESS OF BREATH   Yes Barton Fanny, MD  amoxicillin (AMOXIL) 500 MG capsule Take 2 capsules (1,000 mg total) by mouth 3 (three) times daily. 02/05/16   John Molpus, MD  HYDROcodone-acetaminophen (NORCO/VICODIN) 5-325 MG tablet Take 1 tablet by mouth every 4 (four) hours as needed. 04/08/16   Isla Pence, MD  Melatonin 5 MG CAPS Take by mouth.    Historical Provider, MD  naproxen (NAPROSYN) 500 MG tablet Take 1 tablet (500 mg total) by mouth 2 (two) times daily with a meal. 03/17/16   Leo Grosser, MD  predniSONE (STERAPRED UNI-PAK 21 TAB) 10 MG (21) TBPK tablet Take 1 tablet (10 mg total) by mouth daily. Take 6 tabs by mouth daily  for 2 days, then 5 tabs for 2 days, then 4 tabs for 2 days, then 3 tabs for 2 days, 2 tabs for 2 days, then 1 tab by mouth daily for 2 days 04/08/16   Isla Pence, MD   BP 137/80 mmHg  Pulse 80  Temp(Src) 98.9 F (37.2 C) (Oral)  Resp 18  Ht 5\' 4"  (1.626 m)  Wt 224 lb (101.606 kg)  BMI 38.43 kg/m2  SpO2 100%  LMP 03/14/2016 Physical Exam  Constitutional: She is oriented to person, place, and time. She appears well-developed and well-nourished.  HENT:  Head: Normocephalic and atraumatic.  Eyes: Conjunctivae are normal. Right eye exhibits no discharge. Left eye exhibits no discharge.  Neck: Normal range of  motion. Neck supple. No tracheal deviation present.  Cardiovascular: Normal rate and regular rhythm.   Pulmonary/Chest: Effort normal and breath sounds normal.  Abdominal: Soft. She exhibits no distension. There is no tenderness. There is no guarding.  Musculoskeletal: She exhibits tenderness. She exhibits no edema.  Left shoulder pain with movement Left wrist pain with movement   Neurological: She is alert and oriented to person, place, and time.  Skin: Skin is warm. No rash noted.  Psychiatric: She has a normal mood and affect.  Nursing note and vitals reviewed.   ED Course  Procedures  DIAGNOSTIC STUDIES:  Oxygen Saturation is 100% on RA, normal by my interpretation.    COORDINATION OF CARE:  8:29 PM Discussed treatment plan, which includes X-ray of left shoulder, left wrist, and neck with pt at bedside and pt agreed to plan.  Labs Review Labs Reviewed - No data to display  Imaging Review Dg Cervical Spine Complete  04/08/2016  CLINICAL DATA:  Fall a approximately 2 weeks ago. Neck pain and numbness in right hand. Initial encounter. EXAM: CERVICAL SPINE - COMPLETE 4+ VIEW COMPARISON:  None. FINDINGS: There is no evidence of cervical spine fracture or prevertebral soft tissue swelling. Alignment is normal. Mild anterior longitudinal ligament ossification noted at C4-5 and C5-6 without significant disc space narrowing. No other significant bone abnormality identified. IMPRESSION: No acute findings or significant abnormality. Electronically Signed   By: Earle Gell M.D.   On: 04/08/2016 21:10   Dg Wrist Complete Left  04/08/2016  CLINICAL DATA:  Fall approximately 2 weeks ago. Persistent left wrist pain. Initial encounter. EXAM: LEFT WRIST - COMPLETE 3+ VIEW COMPARISON:  None. FINDINGS: There is no evidence of fracture or dislocation. There is no evidence of arthropathy or other focal bone abnormality. Soft tissues are unremarkable. IMPRESSION: Negative. Electronically Signed   By:  Earle Gell M.D.   On: 04/08/2016 21:07   Dg Shoulder Left  04/08/2016  CLINICAL DATA:  Fall approximately 2 weeks ago. Persistent left shoulder pain. Initial encounter. EXAM: LEFT SHOULDER - 2+ VIEW COMPARISON:  None. FINDINGS: There is no evidence of fracture or dislocation. There is no evidence of arthropathy or other focal bone abnormality. Soft tissues are unremarkable. IMPRESSION: Negative. Electronically Signed   By: Earle Gell M.D.   On: 04/08/2016 21:07   I have personally reviewed and evaluated these images and lab results as part of my medical decision-making.   EKG Interpretation None      MDM  Pt given the number to orthopedics.  She is given a rx for prednisone.  She is instr to return if worse. Final diagnoses:  Cervical radiculopathy  Shoulder strain, left, initial encounter  Wrist strain, left, initial encounter    I personally performed the services described in this documentation, which was scribed in my presence. The recorded information has been reviewed and is accurate.   Isla Pence, MD 04/08/16 2124

## 2016-04-08 NOTE — ED Notes (Signed)
She was seen 3 weeks ago for pain in her left shoulder and wrist. Today her shoulder popped and pain shoots up and down her arm. Her finger tips are numb on her right hand on and off x 2 weeks.

## 2016-07-06 ENCOUNTER — Ambulatory Visit (INDEPENDENT_AMBULATORY_CARE_PROVIDER_SITE_OTHER): Payer: Managed Care, Other (non HMO) | Admitting: Physician Assistant

## 2016-07-06 VITALS — BP 150/90 | HR 80 | Temp 98.2°F | Resp 18 | Ht 63.5 in | Wt 221.2 lb

## 2016-07-06 DIAGNOSIS — R202 Paresthesia of skin: Secondary | ICD-10-CM

## 2016-07-06 LAB — POCT URINALYSIS DIP (MANUAL ENTRY)
BILIRUBIN UA: NEGATIVE
BILIRUBIN UA: NEGATIVE
Glucose, UA: NEGATIVE
LEUKOCYTES UA: NEGATIVE
Nitrite, UA: NEGATIVE
Protein Ur, POC: NEGATIVE
SPEC GRAV UA: 1.025
Urobilinogen, UA: 0.2
pH, UA: 5.5

## 2016-07-06 LAB — CBC WITH DIFFERENTIAL/PLATELET
BASOS ABS: 55 {cells}/uL (ref 0–200)
Basophils Relative: 1 %
EOS ABS: 330 {cells}/uL (ref 15–500)
Eosinophils Relative: 6 %
HCT: 41 % (ref 35.0–45.0)
HEMOGLOBIN: 13.2 g/dL (ref 11.7–15.5)
LYMPHS ABS: 1815 {cells}/uL (ref 850–3900)
Lymphocytes Relative: 33 %
MCH: 25.8 pg — AB (ref 27.0–33.0)
MCHC: 32.2 g/dL (ref 32.0–36.0)
MCV: 80.2 fL (ref 80.0–100.0)
MONOS PCT: 8 %
MPV: 10.2 fL (ref 7.5–12.5)
Monocytes Absolute: 440 cells/uL (ref 200–950)
NEUTROS ABS: 2860 {cells}/uL (ref 1500–7800)
NEUTROS PCT: 52 %
Platelets: 265 10*3/uL (ref 140–400)
RBC: 5.11 MIL/uL — ABNORMAL HIGH (ref 3.80–5.10)
RDW: 14 % (ref 11.0–15.0)
WBC: 5.5 10*3/uL (ref 3.8–10.8)

## 2016-07-06 LAB — POCT URINE PREGNANCY: PREG TEST UR: NEGATIVE

## 2016-07-06 LAB — POCT GLYCOSYLATED HEMOGLOBIN (HGB A1C): HEMOGLOBIN A1C: 6.1

## 2016-07-06 MED ORDER — TRAMADOL HCL 50 MG PO TABS: 25.0000 mg | ORAL_TABLET | Freq: Three times a day (TID) | ORAL | 0 refills | Status: DC | PRN

## 2016-07-06 NOTE — Patient Instructions (Addendum)
Take 1000 mg of Tylenol along with Tramadol.      IF you received an x-ray today, you will receive an invoice from [facility]. Please contact [facility] at [phone] with questions or concerns regarding your invoice.   IF you received labwork today, you will receive an invoice from [employer]. Please contact [facility] at [phone] with questions or concerns regarding your invoice.   Our billing staff will not be able to assist you with questions regarding bills from these companies.  You will be contacted with the lab results as soon as they are available. The fastest way to get your results is to activate your My Chart account. Instructions are located on the last page of this paperwork. If you have not heard from [place] regarding the results in 2 weeks, please contact this office.

## 2016-07-06 NOTE — Progress Notes (Signed)
07/06/2016 3:42 PM   DOB: [birth year] / MRN: [id number]  SUBJECTIVE:  Holly Hopkins is a 45 y.o. female presenting for numbness in her hands and feet.  This started about 6 months ago and is worsening. Reports the pain in her hands and feet are equal. She has a history of diabetes and per her medication list she is not taking any medication for this. She denies any alcohol use.  Denies the use of H2 blockers and PPI.  She is taking Gabapentin for anxiety and impulsivity. She denies weakness.   She has No Known Allergies.   She  has a past medical history of ADD (attention deficit disorder) (06/11/2015); Allergy; Anxiety; Asthma; Depression; H/O tubal ligation (05/15/2003); History of laparoscopic cholecystectomy (1991); Hypertension; Migraine; Seizure ([place]); and Type II or unspecified type diabetes mellitus without mention of complication, not stated as uncontrolled (12/26/2013).    She  reports that she has never smoked. She has never used smokeless tobacco. She reports that she drinks alcohol. She reports that she does not use drugs. She  reports that she does not currently engage in sexual activity but has had female partners.; She reports using the following method of birth control/protection: Surgical. The patient  has a past surgical history that includes Tubal ligation; Laparoscopic cholecystectomy; Induced abortion; Cesarean section; and Dilatation & currettage/hysteroscopy with novasure ablation (N/A, 11/29/2013).  Her family history includes Alcohol abuse in her paternal uncle; Allergy (severe) in her son and son; Arthritis in her paternal aunt; Cancer in her father, maternal grandfather, and paternal grandmother; Diabetes in her father, maternal aunt, maternal grandfather, maternal grandmother, mother, paternal aunt, paternal aunt, and paternal grandmother; Goiter in her maternal uncle; Heart disease in her maternal aunt, maternal grandfather, maternal grandmother, mother, and paternal  grandmother; Hyperlipidemia in her father; Hypertension in her father, maternal grandmother, and paternal grandmother; Mental illness in her brother; Migraines in her son and son.  Review of Systems  Constitutional: Negative for chills and fever.  Cardiovascular: Negative for chest pain.  Genitourinary: Negative for dysuria.  Musculoskeletal: Positive for myalgias. Negative for back pain, falls and neck pain.  Skin: Negative for itching and rash.  Neurological: Negative for dizziness and headaches.  Endo/Heme/Allergies: Negative for polydipsia.    The problem list and medications were reviewed and updated by myself where necessary and exist elsewhere in the encounter.   OBJECTIVE:  BP (!) 150/90 (BP Location: Right Arm, Patient Position: Sitting, Cuff Size: Large)   Pulse 80   Temp 98.2 F (36.8 C) (Oral)   Resp 18   Ht 5' 3.5" (1.613 m)   Wt 221 lb 3.2 oz (100.3 kg)   SpO2 96%   BMI 38.57 kg/m   Physical Exam  Constitutional: She is oriented to person, place, and time.  Eyes: Conjunctivae and EOM are normal. Pupils are equal, round, and reactive to light.  Cardiovascular: Normal rate and regular rhythm.   Pulmonary/Chest: Effort normal and breath sounds normal.  Abdominal: Soft. Bowel sounds are normal.  Musculoskeletal: Normal range of motion. She exhibits no edema, tenderness or deformity.  Neurological: She is alert and oriented to person, place, and time. She displays normal reflexes. No cranial nerve deficit. She exhibits normal muscle tone. Coordination normal.  Skin: Skin is warm and dry.  Psychiatric: She has a normal mood and affect.  Vitals reviewed.   Results for orders placed or performed in visit on 07/06/16 (from the past 72 hour(s))  POCT glycosylated hemoglobin (Hb A1C)  Status: None   Collection Time: 07/06/16  3:08 PM  Result Value Ref Range   Hemoglobin A1C 6.1   POCT urinalysis dipstick     Status: Abnormal   Collection Time: 07/06/16  3:25 PM    Result Value Ref Range   Color, UA yellow yellow   Clarity, UA clear clear   Glucose, UA negative negative   Bilirubin, UA negative negative   Ketones, POC UA negative negative   Spec Grav, UA 1.025    Blood, UA small (A) negative   pH, UA 5.5    Protein Ur, POC negative negative   Urobilinogen, UA 0.2    Nitrite, UA Negative Negative   Leukocytes, UA Negative Negative  POCT urine pregnancy     Status: None   Collection Time: 07/06/16  3:29 PM  Result Value Ref Range   Preg Test, Ur Negative Negative   No results found.  ASSESSMENT AND PLAN  Holly Hopkins was seen today for numbness.  Diagnoses and all orders for this visit:  Paresthesia of both feet: Her prediabetes has improved.  Will further evaluate labs.  If I can not get an answer I will send her to neurology for further eval.  Doubt MSK etiology as this problem involves the upper and lower extremities and is bilateral.  -     HIV antibody -     Care order/instruction:  Paresthesia of both hands -     POCT glycosylated hemoglobin (Hb A1C) -     CBC with Differential/Platelet -     HIV antibody -     POCT urinalysis dipstick -     POCT urine pregnancy -     Care order/instruction: -     traMADol (ULTRAM) 50 MG tablet; Take 0.5 tablets (25 mg total) by mouth every 8 (eight) hours as needed.    The patient is advised to call or return to clinic if she does not see an improvement in symptoms, or to seek the care of the closest emergency department if she worsens with the above plan.   Philis Fendt, MHS, PA-C [facility] 07/06/2016 3:42 PM

## 2016-07-07 LAB — SEDIMENTATION RATE: Sed Rate: 11 mm/hr (ref 0–20)

## 2016-07-07 LAB — HIV ANTIBODY (ROUTINE TESTING W REFLEX): HIV: NONREACTIVE

## 2016-10-06 ENCOUNTER — Ambulatory Visit (INDEPENDENT_AMBULATORY_CARE_PROVIDER_SITE_OTHER): Payer: Managed Care, Other (non HMO) | Admitting: Family Medicine

## 2016-10-06 VITALS — BP 110/78 | HR 88 | Temp 98.7°F | Resp 18 | Ht 63.5 in | Wt 230.0 lb

## 2016-10-06 DIAGNOSIS — J4520 Mild intermittent asthma, uncomplicated: Secondary | ICD-10-CM | POA: Diagnosis not present

## 2016-10-06 DIAGNOSIS — R0981 Nasal congestion: Secondary | ICD-10-CM

## 2016-10-06 DIAGNOSIS — J069 Acute upper respiratory infection, unspecified: Secondary | ICD-10-CM | POA: Diagnosis not present

## 2016-10-06 MED ORDER — BENZONATATE 100 MG PO CAPS: 100.0000 mg | ORAL_CAPSULE | Freq: Two times a day (BID) | ORAL | 0 refills | Status: DC | PRN

## 2016-10-06 MED ORDER — ALBUTEROL SULFATE HFA 108 (90 BASE) MCG/ACT IN AERS: INHALATION_SPRAY | RESPIRATORY_TRACT | 3 refills | Status: DC

## 2016-10-06 NOTE — Progress Notes (Signed)
Chief Complaint  Patient presents with  . Fever  . Cough  . Generalized Body Aches    HPI   Pt reports starting 10/02/16 she was having cough, fever and generalized body aches She reports that she had a temperature of 103 on 10/03/16 She was taking tylenol 600mg  every six hours. She reports that her fever broke yesterday. She states that she also had facial pain around her eyes. She states that her cough is productive of bloody and yellow sputum. She denies diarrhea or vomiting She received a flu vaccine August 03, 2016. There are no infants, pregnant people or elderly at the house.  She states that her asthma is well controlled She reports that the crisp air helps her asthma She last used her inhaler around July 2017 This acute URI is not triggering her asthma Her usual triggers are pollen and seasonal allergies    Past Medical History:  Diagnosis Date  . ADD (attention deficit disorder) 06/11/2015  . Allergy   . Anxiety   . Asthma    allergy related..  . Depression   . H/O tubal ligation 05/15/2003  . History of laparoscopic cholecystectomy 1991  . Hypertension   . Migraine    migraine is aura presenting before seizure.  . Seizure ([place])    stress induced; has migraine aura; last seizure in 2010  . Type II or unspecified type diabetes mellitus without mention of complication, not stated as uncontrolled 12/26/2013    Current Outpatient Prescriptions  Medication Sig Dispense Refill  . albuterol (VENTOLIN HFA) 108 (90 Base) MCG/ACT inhaler INHALE 2 PUFFS INTO THE LUNGS EVERY 4 (FOUR) HOURS AS NEEDED. FOR SHORTNESS OF BREATH 18 g 3  . amphetamine-dextroamphetamine (ADDERALL) 20 MG tablet Take 20 mg by mouth 2 (two) times daily.    . citalopram (CELEXA) 40 MG tablet Take 40 mg by mouth daily.    . cloNIDine (CATAPRES) 0.2 MG tablet Take 0.2 mg by mouth 2 (two) times daily.    . ferrous sulfate 325 (65 FE) MG tablet Take 325 mg by mouth 2 (two) times daily with a meal.  Reported on 10/28/2015    . gabapentin (NEURONTIN) 300 MG capsule Take 300 mg by mouth 3 (three) times daily.    . Melatonin 5 MG CAPS Take by mouth.    . Multiple Vitamin (MULTIVITAMIN) capsule Take 1 capsule by mouth daily.    . naproxen (NAPROSYN) 500 MG tablet Take 1 tablet (500 mg total) by mouth 2 (two) times daily with a meal. 14 tablet 0  . traMADol (ULTRAM) 50 MG tablet Take 0.5 tablets (25 mg total) by mouth every 8 (eight) hours as needed. 30 tablet 0  . benzonatate (TESSALON) 100 MG capsule Take 1 capsule (100 mg total) by mouth 2 (two) times daily as needed for cough. 30 capsule 0   No current facility-administered medications for this visit.     Allergies: No Known Allergies  Past Surgical History:  Procedure Laterality Date  . CESAREAN SECTION     x2  . DILITATION & CURRETTAGE/HYSTROSCOPY WITH NOVASURE ABLATION N/A 11/29/2013   Procedure: DILATATION & CURETTAGE/HYSTEROSCOPY WITH  RESECTION;  Surgeon: Ena Dawley, MD;  Location: [facility];  Service: Gynecology;  Laterality: N/A;  . INDUCED ABORTION    . LAPAROSCOPIC CHOLECYSTECTOMY    . TUBAL LIGATION      Social History   Social History  . Marital status: Single    Spouse name: n/a  . Number of children: 2  .  Years of education: 29   Occupational History  . Designer, industrial/product Rep Time [place] History Main Topics  . Smoking status: Never Smoker  . Smokeless tobacco: Never Used  . Alcohol use 0.0 - 0.5 oz/week     Comment: socially/holidays  . Drug use: No  . Sexual activity: Not Currently    Partners: Male    Birth control/ protection: Surgical     Comment: BTL   Other Topics Concern  . None   Social History Narrative  . None    ROS  Objective: Vitals:   10/06/16 1353  BP: 110/78  Pulse: 88  Resp: 18  Temp: 98.7 F (37.1 C)  TempSrc: Oral  SpO2: 99%  Weight: 230 lb (104.3 kg)  Height: 5' 3.5" (1.613 m)    Physical Exam General: alert, oriented, in NAD Head:  normocephalic, atraumatic, no sinus tenderness Eyes: EOM intact, no scleral icterus or conjunctival injection Ears: TM clear bilaterally Throat: no pharyngeal exudate or erythema Lymph: no posterior auricular, submental or cervical lymph adenopathy Heart: normal rate, normal sinus rhythm, no murmurs Lungs: clear to auscultation bilaterally, no wheezing   Assessment and Plan [name] was seen today for fever, cough and generalized body aches.  Diagnoses and all orders for this visit:  Sinus congestion- continue hydration and flonase  Acute URI- viral etiology  Continue tylenol for fevers Rest and hydration  Work note given  Mild intermittent asthma without complication- gave asthma action plan No acute exacerbation Discussed that she should monitor symptoms and return if she has to use her inhaler frequently due to coughing and sob Refilled albuterol today  Other orders -     benzonatate (TESSALON) 100 MG capsule; Take 1 capsule (100 mg total) by mouth 2 (two) times daily as needed for cough. -     albuterol (VENTOLIN HFA) 108 (90 Base) MCG/ACT inhaler; INHALE 2 PUFFS INTO THE LUNGS EVERY 4 (FOUR) HOURS AS NEEDED. FOR SHORTNESS OF BREATH     Ailsa Mireles A Courtnay Petrilla

## 2016-10-06 NOTE — Patient Instructions (Addendum)
IF you received an x-ray today, you will receive an invoice from [facility]. Please contact [facility] at [phone] with questions or concerns regarding your invoice.   IF you received labwork today, you will receive an invoice from [facility]. Please contact [facility] at [phone] with questions or concerns regarding your invoice.   Our billing staff will not be able to assist you with questions regarding bills from these companies.  You will be contacted with the lab results as soon as they are available. The fastest way to get your results is to activate your My Chart account. Instructions are located on the last page of this paperwork. If you have not heard from [place] regarding the results in 2 weeks, please contact this office.     Introduction Name: ________________________________ Date: _______  Follow-Up Visit With Health Care Provider Bring your medicines to your follow-up visits.  Health care provider name: ____________________  Telephone: ____________________  How often should I see my health care provider? ____________________ The actions that you should take to control your asthma are based on the symptoms that you are having. The condition can be divided into 3 zones: the green zone, yellow zone, and red zone. Follow the action steps for the zone that you are in each day. Green zone: when asthma is under control   Signs and symptoms You may not have any symptoms while you are in the green zone. This means that you:  Have no coughing or wheezing, even while you are working or playing.  Sleep through the night.  Are breathing well. If you use a peak flow meter:  The peak flow is above ______ (80% of your personal best or greater). You should take these medicines every day:  Controller medicine and dosage: ________________  Controller medicine and dosage: ________________  Controller medicine and dosage: ________________  Controller  medicine and dosage: ________________  Before exercise, use this reliever or rescue medicine: ________________ Call your health care provider if:  You are using a reliever or rescue medicine more than 2-3 times per week. Yellow zone: when asthma is getting worse   Signs and symptoms When you are in the yellow zone, you may have symptoms that interfere with exercise, are noticeably worse after exposure to triggers, or are worse at the first sign of a cold (upper respiratory infection). These may include:  Waking from sleep.  Coughing, especially at night or first thing in the morning.  Mild wheezing.  Chest tightness. If you use a peak flow meter:  The peak flow is _____ to _____ (50-79% of your personal best). Add the following medicine to the ones that you use daily:  Reliever or rescue medicine and dosage: ________________  Additional medicine and dosage: ________________ Call your health care provider if:  You are using a reliever or rescue medicine more than 2-3 times per week.  You remain in the yellow zone for _____ hours. Red zone: when asthma is severe   Signs and symptoms You will likely feel distressed and have symptoms at rest that restrict your activity. You are in the red zone if:  Your breathing is hard and quickly.  Your nose opens wide, your ribs show, and your neck muscles become visible when you breathe in.  Your lips, fingers, or toes are a bluish color.  You have trouble speaking in full sentences.  Your symptoms do not improve within 15-20 minutes after you use your reliever or rescue medicine (bronchodilator). If you use  a peak flow meter:  The peak flow is less than _____ (less than 50% of your personal best). Call your local emergency services (911 in the [place] right away or seek help at the emergency department of the nearest hospital.  Use your reliever or rescue medicine.  Start a nebulizer treatment or take 2-4 puffs from a metered-dose  inhaler with a spacer.  Repeat this action every 15-20 minutes until help arrives. What are some common asthma triggers? Discuss your asthma triggers with your health care provider. Some common triggers are:  Dander from the skin, hair, or feathers of animals.  Dust mites.  Cockroaches.  Pollen from trees or grass.  Mold.  Cigarette or tobacco smoke.  Air pollutants, such as dust, household cleaners, hair sprays, aerosol sprays, scented candles, paint fumes, strong chemicals, or strong odors.  Cold air or changes in weather. Cold air may cause inflammation. Winds increase molds and pollens in the air.  Strong emotions, such as crying or laughing hard.  Stress.  Certain medicines, such as aspirin or beta blockers.  Sulfites in foods and drinks, such as dried fruits and wine.  Infections or inflammatory conditions, such as:  Flu (influenza).  Upper respiratory tract infection.  Lower respiratory tract infection (pneumonia or bronchitis).  Inflammation of the nasal membranes (rhinitis).  Gastroesophageal reflux disease (GERD). GERD is a condition in which stomach acid comes up into the throat (esophagus).  Exercise or strenuous activity. This information is not intended to replace advice given to you by your health care provider. Make sure you discuss any questions you have with your health care provider. Document Released: 07/24/2009 Document Revised: 05/23/2016 Document Reviewed: 07/15/2014 Elsevier Interactive Patient Education  2017 [employer].

## 2016-10-28 ENCOUNTER — Ambulatory Visit: Payer: Managed Care, Other (non HMO)

## 2016-11-02 ENCOUNTER — Encounter: Payer: Managed Care, Other (non HMO) | Admitting: Family Medicine

## 2016-11-08 ENCOUNTER — Emergency Department (HOSPITAL_BASED_OUTPATIENT_CLINIC_OR_DEPARTMENT_OTHER)
Admission: EM | Admit: 2016-11-08 | Discharge: 2016-11-08 | Disposition: A | Discharge status: Home / Self Care | Payer: Managed Care, Other (non HMO) | Attending: Emergency Medicine | Admitting: Emergency Medicine

## 2016-11-08 ENCOUNTER — Encounter (HOSPITAL_BASED_OUTPATIENT_CLINIC_OR_DEPARTMENT_OTHER): Payer: Self-pay | Admitting: *Deleted

## 2016-11-08 DIAGNOSIS — M542 Cervicalgia: Secondary | ICD-10-CM | POA: Insufficient documentation

## 2016-11-08 DIAGNOSIS — I10 Essential (primary) hypertension: Secondary | ICD-10-CM | POA: Insufficient documentation

## 2016-11-08 DIAGNOSIS — J45909 Unspecified asthma, uncomplicated: Secondary | ICD-10-CM | POA: Diagnosis not present

## 2016-11-08 DIAGNOSIS — M25512 Pain in left shoulder: Secondary | ICD-10-CM | POA: Diagnosis not present

## 2016-11-08 DIAGNOSIS — E119 Type 2 diabetes mellitus without complications: Secondary | ICD-10-CM | POA: Diagnosis not present

## 2016-11-08 DIAGNOSIS — Z79899 Other long term (current) drug therapy: Secondary | ICD-10-CM | POA: Insufficient documentation

## 2016-11-08 MED ORDER — METHOCARBAMOL 500 MG PO TABS: 500.0000 mg | ORAL_TABLET | Freq: Two times a day (BID) | ORAL | 0 refills | Status: DC

## 2016-11-08 MED ORDER — MELOXICAM 15 MG PO TABS: 15.0000 mg | ORAL_TABLET | Freq: Every day | ORAL | 0 refills | Status: AC

## 2016-11-08 NOTE — Discharge Instructions (Signed)
Continue ice and heat. Wear sling as needed for comfort. Take mobic for pain and inflammation. Robaxin for muscle spasms. Follow up with orthopedics specialist for further evaluation and imaging as well as treatment.

## 2016-11-08 NOTE — ED Provider Notes (Signed)
[facility] Provider Note   CSN: [id number] Arrival date & time: 11/08/16  1106     History   Chief Complaint Chief Complaint  Patient presents with  . Arm Pain    HPI Holly Hopkins is a 46 y.o. female.  HPI Holly Hopkins is a 46 y.o. female presents to ED with complaint of left arm pain. Pt states she fell last May, about 8 months ago, onto left arm. States was seen at urgent care and twice in ED for this. States xrays were done and was told they are normal. States her arm pain has never improved. States it is worsened in the last several days. States she is unable to use her arm, it is difficult for her to sleep. Denies any numbness or tingling in her hand. Denies any pain to her elbow or wrist. She states she is having some neck pain but thinks that his spasms. She reports taking ibuprofen and Tylenol for pain. She states she has been applying ice and heat. She has also done some range of motion exercises. She denies seeing an orthopedic specialist for this. She denies any other complaints or new injuries. No fever or chills.  Past Medical History:  Diagnosis Date  . ADD (attention deficit disorder) 06/11/2015  . Allergy   . Anxiety   . Asthma    allergy related..  . Depression   . H/O tubal ligation 05/15/2003  . History of laparoscopic cholecystectomy 1991  . Hypertension   . Migraine    migraine is aura presenting before seizure.  . 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 Type II or unspecified type diabetes mellitus without mention of complication, not stated as uncontrolled 12/26/2013    Patient Active Problem List   Diagnosis Date Noted  . Obesity, unspecified 12/26/2013  . Type II or unspecified type diabetes mellitus without mention of complication, not stated as uncontrolled 12/26/2013  . Chest pain 10/17/2013  . Pedal edema 10/17/2013  . Menorrhagia 10/17/2013  . Microcytic anemia 10/17/2013  . Seizure  ([place])   . 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 PIH (pregnancy induced hypertension) 01/25/2012  . Fibroids 01/25/2012  . Breast mass, left 01/25/2012  . STD (sexually transmitted disease) 01/25/2012  . Insulin resistance 01/25/2012  . Migraine   . ASTHMA 01/17/2007  . BIPOLAR DISORDER 12/07/2006  . Allergic rhinitis, cause unspecified 12/07/2006  . GASTROESOPHAGEAL REFLUX, NO ESOPHAGITIS 12/07/2006    Past Surgical History:  Procedure Laterality Date  . CESAREAN SECTION     x2  . DILITATION & CURRETTAGE/HYSTROSCOPY WITH NOVASURE ABLATION N/A 11/29/2013   Procedure: DILATATION & CURETTAGE/HYSTEROSCOPY WITH  RESECTION;  Surgeon: Ena Dawley, MD;  Location: [facility];  Service: Gynecology;  Laterality: N/A;  . INDUCED ABORTION    . LAPAROSCOPIC CHOLECYSTECTOMY    . TUBAL LIGATION      OB History    Gravida Para Term Preterm AB Living   3 2 2  0 1 2   SAB TAB Ectopic Multiple Live Births   0 0 0 0         Home Medications    Prior to Admission medications   Medication Sig Start Date End Date Taking? Authorizing Provider  albuterol (VENTOLIN HFA) 108 (90 Base) MCG/ACT inhaler INHALE 2 PUFFS INTO THE LUNGS EVERY 4 (FOUR) HOURS AS NEEDED. FOR SHORTNESS OF BREATH 10/06/16   Forrest Moron, MD  amphetamine-dextroamphetamine (ADDERALL) 20 MG  tablet Take 20 mg by mouth 2 (two) times daily.    Historical Provider, MD  benzonatate (TESSALON) 100 MG capsule Take 1 capsule (100 mg total) by mouth 2 (two) times daily as needed for cough. 10/06/16   Forrest Moron, MD  citalopram (CELEXA) 40 MG tablet Take 40 mg by mouth daily.    Historical Provider, MD  cloNIDine (CATAPRES) 0.2 MG tablet Take 0.2 mg by mouth 2 (two) times daily.    Historical Provider, MD  ferrous sulfate 325 (65 FE) MG tablet Take 325 mg by mouth 2 (two) times daily with a meal. Reported on 10/28/2015 10/18/13   Delfina Redwood, MD  gabapentin (NEURONTIN) 300 MG capsule Take 300 mg by mouth 3 (three) times  daily.    Historical Provider, MD  Melatonin 5 MG CAPS Take by mouth.    Historical Provider, MD  Multiple Vitamin (MULTIVITAMIN) capsule Take 1 capsule by mouth daily.    Historical Provider, MD  naproxen (NAPROSYN) 500 MG tablet Take 1 tablet (500 mg total) by mouth 2 (two) times daily with a meal. 03/17/16   Leo Grosser, MD  traMADol (ULTRAM) 50 MG tablet Take 0.5 tablets (25 mg total) by mouth every 8 (eight) hours as needed. 07/06/16   Tereasa Coop, PA-C    Family History Family History  Problem Relation Age of Onset  . Diabetes Mother   . Heart disease Mother   . Diabetes Father   . Cancer Father     THYROID  . Hypertension Father   . Hyperlipidemia Father   . Mental illness Brother     PSTD,DEPRESSION  . Migraines Son   . Allergy (severe) Son   . Diabetes Maternal Aunt   . Goiter Maternal Uncle   . Diabetes Paternal Aunt   . Alcohol abuse Paternal Uncle     ALCOHOL AND DRUGS  . Heart disease Maternal Grandmother   . Diabetes Maternal Grandmother   . Hypertension Maternal Grandmother   . Diabetes Maternal Grandfather   . Heart disease Maternal Grandfather   . Cancer Maternal Grandfather     QUESTION WHAT KIND  . Heart disease Paternal Grandmother   . Diabetes Paternal Grandmother   . Hypertension Paternal Grandmother   . Cancer Paternal Grandmother     BREAST  . Migraines Son   . Allergy (severe) Son   . Heart disease Maternal Aunt   . Diabetes Paternal Aunt   . Arthritis Paternal Aunt     Social History Social History  Substance Use Topics  . Smoking status: Never Smoker  . Smokeless tobacco: Never Used  . Alcohol use 0.0 - 0.5 oz/week     Comment: socially/holidays     Allergies   Patient has no known allergies.   Review of Systems Review of Systems  Constitutional: Negative for chills and fever.  Respiratory: Negative for cough, chest tightness and shortness of breath.   Cardiovascular: Negative for chest pain, palpitations and leg swelling.    Gastrointestinal: Negative for abdominal pain, diarrhea, nausea and vomiting.  Musculoskeletal: Positive for arthralgias, myalgias and neck pain. Negative for neck stiffness.  Skin: Negative for rash.  Neurological: Negative for dizziness, weakness, numbness and headaches.  All other systems reviewed and are negative.    Physical Exam Updated Vital Signs BP 112/66   Pulse 77   Temp 98.1 F (36.7 C) (Oral)   Resp 20   Ht 5' 3.5" (1.613 m)   Wt 104.3 kg   SpO2 100%  BMI 40.10 kg/m   Physical Exam  Constitutional: She appears well-developed and well-nourished. No distress.  Eyes: Conjunctivae are normal.  Neck: Neck supple.  Musculoskeletal:  Diffuse tenderness to palpation over left shoulder joint. Pain with range of motion of the shoulder. Limited range of motion due to pain. Tenderness mainly over anterior joint and biceps. Weakness of the bicep muscle noted. Positive arm drop test. Distal radial pulses intact. Elbow and wrist normal.  Neurological: She is alert.  Skin: Skin is warm and dry. Capillary refill takes less than 2 seconds.  Nursing note and vitals reviewed.    ED Treatments / Results  Labs (all labs ordered are listed, but only abnormal results are displayed) Labs Reviewed - No data to display  EKG  EKG Interpretation None       Radiology No results found.  Procedures Procedures (including critical care time)  Medications Ordered in ED Medications - No data to display   Initial Impression / Assessment and Plan / ED Course  I have reviewed the triage vital signs and the nursing notes.  Pertinent labs & imaging results that were available during my care of the patient were reviewed by me and considered in my medical decision making (see chart for details).     Patient with left shoulder pain for 8 months after an injury. No new injuries. No signs of infection. Neurovascularly intact. Concern for possible biceps versus rotator cuff muscle  injury. Will add muscle relaxant to her NSAIDs and a sling for comfort. Follow with orthopedics specialist.  Vitals:   11/08/16 1122  BP: 112/66  Pulse: 77  Resp: 20  Temp: 98.1 F (36.7 C)  TempSrc: Oral  SpO2: 100%  Weight: 104.3 kg  Height: 5' 3.5" (1.613 m)     Final Clinical Impressions(s) / ED Diagnoses   Final diagnoses:  Left shoulder pain, unspecified chronicity    New Prescriptions New Prescriptions   No medications on file     Jeannett Senior, PA-C 11/08/16 [place], DO 11/09/16 [phone]

## 2016-11-08 NOTE — ED Triage Notes (Addendum)
Left arm pain since a fall in May. States she has been seen numerous times by different doctors and no one can find anything wrong.

## 2017-06-12 ENCOUNTER — Encounter (HOSPITAL_BASED_OUTPATIENT_CLINIC_OR_DEPARTMENT_OTHER): Payer: Self-pay

## 2017-06-12 ENCOUNTER — Emergency Department (HOSPITAL_BASED_OUTPATIENT_CLINIC_OR_DEPARTMENT_OTHER)
Admission: EM | Admit: 2017-06-12 | Discharge: 2017-06-12 | Disposition: A | Discharge status: Home / Self Care | Payer: 59 | Attending: Emergency Medicine | Admitting: Emergency Medicine

## 2017-06-12 DIAGNOSIS — H60393 Other infective otitis externa, bilateral: Secondary | ICD-10-CM | POA: Diagnosis not present

## 2017-06-12 DIAGNOSIS — R21 Rash and other nonspecific skin eruption: Secondary | ICD-10-CM | POA: Diagnosis present

## 2017-06-12 DIAGNOSIS — J45909 Unspecified asthma, uncomplicated: Secondary | ICD-10-CM | POA: Insufficient documentation

## 2017-06-12 DIAGNOSIS — I10 Essential (primary) hypertension: Secondary | ICD-10-CM | POA: Insufficient documentation

## 2017-06-12 DIAGNOSIS — Z79899 Other long term (current) drug therapy: Secondary | ICD-10-CM | POA: Diagnosis not present

## 2017-06-12 DIAGNOSIS — E119 Type 2 diabetes mellitus without complications: Secondary | ICD-10-CM | POA: Diagnosis not present

## 2017-06-12 DIAGNOSIS — T50905A Adverse effect of unspecified drugs, medicaments and biological substances, initial encounter: Secondary | ICD-10-CM

## 2017-06-12 DIAGNOSIS — T495X5A Adverse effect of ophthalmological drugs and preparations, initial encounter: Secondary | ICD-10-CM | POA: Diagnosis not present

## 2017-06-12 DIAGNOSIS — Z791 Long term (current) use of non-steroidal anti-inflammatories (NSAID): Secondary | ICD-10-CM | POA: Insufficient documentation

## 2017-06-12 MED ORDER — TRAMADOL HCL 50 MG PO TABS: 50.0000 mg | ORAL_TABLET | Freq: Four times a day (QID) | ORAL | 0 refills | Status: AC | PRN

## 2017-06-12 MED ORDER — CIPROFLOXACIN-DEXAMETHASONE 0.3-0.1 % OT SUSP
4.0000 [drp] | Freq: Two times a day (BID) | OTIC | Status: AC
  Administered 2017-06-12: 4 [drp] via OTIC
  Filled 2017-06-12: qty 7.5

## 2017-06-12 NOTE — ED Triage Notes (Signed)
C/o allergic reaction-itching, rash to external ears x 3 days-started after one day use of rx ear drops from [facility] to treat bilat ear infection-NAD-steady gait

## 2017-06-12 NOTE — Discharge Instructions (Signed)
Apply the 2 drops into both ears for 1 weeks Avoid products containing NEOMYCIN. Contact a health care provider if: You have a fever. After 3 days your ear is still red, swollen, painful, or draining pus. Your redness, swelling, or pain gets worse. You have a severe headache. You have redness, swelling, pain, or tenderness in the area behind your ear.

## 2017-06-12 NOTE — ED Provider Notes (Signed)
[facility] Provider Note   CSN: [id number] Arrival date & time: 06/12/17  1233     History   Chief Complaint Chief Complaint  Patient presents with  . Allergic Reaction    HPI Holly Hopkins is a 46 y.o. female.He presents emergency Department with chief complaint of bilateral otitis externa and allergic reaction to medications. She was seen by her primary care physician several days ago and started on Polysporin otic solution. Patient states that she immediately began having pain and swelling in her uterus after application of the medication worse than prior to seeing her physician. She also developed a rash down the side of her face bilaterally where the medication drips down against her skin. She's never had allergic reaction prior to this before. She feels "miserable." And still complains of draining and pain in bilateral ears. He denies any fevers or chills. Although her medical history states she is diabetic patient claims to be prediabetic. She does not appear to be on any medications for diabetes at this time.  HPI  Past Medical History:  Diagnosis Date  . ADD (attention deficit disorder) 06/11/2015  . Allergy   . Anxiety   . Asthma    allergy related..  . Depression   . H/O tubal ligation 05/15/2003  . History of laparoscopic cholecystectomy 1991  . Hypertension   . Migraine    migraine is aura presenting before seizure.  . 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 Type II or unspecified type diabetes mellitus without mention of complication, not stated as uncontrolled 12/26/2013    Patient Active Problem List   Diagnosis Date Noted  . Obesity, unspecified 12/26/2013  . Type II or unspecified type diabetes mellitus without mention of complication, not stated as uncontrolled 12/26/2013  . Chest pain 10/17/2013  . Pedal edema 10/17/2013  . Menorrhagia 10/17/2013  . Microcytic anemia 10/17/2013  . Seizure ([place])   .  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 PIH (pregnancy induced hypertension) 01/25/2012  . Fibroids 01/25/2012  . Breast mass, left 01/25/2012  . STD (sexually transmitted disease) 01/25/2012  . Insulin resistance 01/25/2012  . Migraine   . ASTHMA 01/17/2007  . BIPOLAR DISORDER 12/07/2006  . Allergic rhinitis, cause unspecified 12/07/2006  . GASTROESOPHAGEAL REFLUX, NO ESOPHAGITIS 12/07/2006    Past Surgical History:  Procedure Laterality Date  . CESAREAN SECTION     x2  . DILITATION & CURRETTAGE/HYSTROSCOPY WITH NOVASURE ABLATION N/A 11/29/2013   Procedure: DILATATION & CURETTAGE/HYSTEROSCOPY WITH  RESECTION;  Surgeon: Ena Dawley, MD;  Location: [facility];  Service: Gynecology;  Laterality: N/A;  . INDUCED ABORTION    . LAPAROSCOPIC CHOLECYSTECTOMY    . TUBAL LIGATION      OB History    Gravida Para Term Preterm AB Living   3 2 2  0 1 2   SAB TAB Ectopic Multiple Live Births   0 0 0 0         Home Medications    Prior to Admission medications   Medication Sig Start Date End Date Taking? Authorizing Provider  albuterol (VENTOLIN HFA) 108 (90 Base) MCG/ACT inhaler INHALE 2 PUFFS INTO THE LUNGS EVERY 4 (FOUR) HOURS AS NEEDED. FOR SHORTNESS OF BREATH 10/06/16   Forrest Moron, MD  amphetamine-dextroamphetamine (ADDERALL) 20 MG tablet Take 20 mg by mouth 2 (two) times daily.    [provider]  benzonatate (TESSALON) 100 MG capsule Take 1 capsule (100  mg total) by mouth 2 (two) times daily as needed for cough. 10/06/16   Forrest Moron, MD  citalopram (CELEXA) 40 MG tablet Take 40 mg by mouth daily.    [provider]  cloNIDine (CATAPRES) 0.2 MG tablet Take 0.2 mg by mouth 2 (two) times daily.    [provider]  ferrous sulfate 325 (65 FE) MG tablet Take 325 mg by mouth 2 (two) times daily with a meal. Reported on 10/28/2015 10/18/13   Delfina Redwood, MD  gabapentin (NEURONTIN) 300 MG capsule Take 300 mg by mouth 3 (three) times  daily.    [provider]  Melatonin 5 MG CAPS Take by mouth.    [provider]  meloxicam (MOBIC) 15 MG tablet Take 1 tablet (15 mg total) by mouth daily. 11/08/16   Kirichenko, Tatyana, PA-C  methocarbamol (ROBAXIN) 500 MG tablet Take 1 tablet (500 mg total) by mouth 2 (two) times daily. 11/08/16   Kirichenko, Lahoma Rocker, PA-C  Multiple Vitamin (MULTIVITAMIN) capsule Take 1 capsule by mouth daily.    [provider]  naproxen (NAPROSYN) 500 MG tablet Take 1 tablet (500 mg total) by mouth 2 (two) times daily with a meal. 03/17/16   Leo Grosser, MD  traMADol (ULTRAM) 50 MG tablet Take 1 tablet (50 mg total) by mouth every 6 (six) hours as needed. 06/12/17   Margarita Mail, PA-C    Family History Family History  Problem Relation Age of Onset  . Diabetes Mother   . Heart disease Mother   . Diabetes Father   . Cancer Father        THYROID  . Hypertension Father   . Hyperlipidemia Father   . Mental illness Brother        PSTD,DEPRESSION  . Migraines Son   . Allergy (severe) Son   . Diabetes Maternal Aunt   . Goiter Maternal Uncle   . Diabetes Paternal Aunt   . Alcohol abuse Paternal Uncle        ALCOHOL AND DRUGS  . Heart disease Maternal Grandmother   . Diabetes Maternal Grandmother   . Hypertension Maternal Grandmother   . Diabetes Maternal Grandfather   . Heart disease Maternal Grandfather   . Cancer Maternal Grandfather        QUESTION WHAT KIND  . Heart disease Paternal Grandmother   . Diabetes Paternal Grandmother   . Hypertension Paternal Grandmother   . Cancer Paternal Grandmother        BREAST  . Migraines Son   . Allergy (severe) Son   . Heart disease Maternal Aunt   . Diabetes Paternal Aunt   . Arthritis Paternal Aunt     Social History Social History  Substance Use Topics  . Smoking status: Never Smoker  . Smokeless tobacco: Never Used  . Alcohol use Yes     Comment: occ     Allergies   Patient has no known allergies.   Review  of Systems Review of Systems  Ten systems reviewed and are negative for acute change, except as noted in the HPI.   Physical Exam Updated Vital Signs BP 116/89 (BP Location: Left Arm)   Pulse 92   Temp 98.5 F (36.9 C) (Oral)   Resp 20   SpO2 99%   Physical Exam  Constitutional: She is oriented to person, place, and time. She appears well-developed and well-nourished. No distress.  HENT:  Head: Normocephalic and atraumatic.  Bilateral ear canals with thick white discharge, minimal swelling TMs  are normal bilaterally. Tenderness with movement of the pinna, morbilliform red rash from the tragus down the side of the face on both sides.  Eyes: Conjunctivae are normal. No scleral icterus.  Neck: Normal range of motion. Neck supple.  Cardiovascular: Normal rate, regular rhythm and normal heart sounds.  Exam reveals no gallop and no friction rub.   No murmur heard. Pulmonary/Chest: Effort normal and breath sounds normal. No respiratory distress.  Abdominal: Soft. Bowel sounds are normal. She exhibits no distension and no mass. There is no tenderness. There is no guarding.  Neurological: She is alert and oriented to person, place, and time.  Skin: Skin is warm and dry. She is not diaphoretic.  Psychiatric: Her behavior is normal.  Nursing note and vitals reviewed.    ED Treatments / Results  Labs (all labs ordered are listed, but only abnormal results are displayed) Labs Reviewed - No data to display  EKG  EKG Interpretation None       Radiology No results found.  Procedures Procedures (including critical care time)  Medications Ordered in ED Medications  ciprofloxacin-dexamethasone (CIPRODEX) 0.3-0.1 % OTIC (EAR) suspension 4 drop (4 drops Both EARS Given 06/12/17 1319)     Initial Impression / Assessment and Plan / ED Course  I have reviewed the triage vital signs and the nursing notes.  Pertinent labs & imaging results that were available during my care of the  patient were reviewed by me and considered in my medical decision making (see chart for details).     Ms.  Patient with allergic reaction to Polysporin otic and bilateral otitis externa. Patient is advised to use Ciprodex drops in both the ears. She follow with her primary care physician. We'll add tramadol for pain relief. Patient appears safe for discharge at this time. Discussed reasons to seek immediate medical care in the emergency department.  Final Clinical Impressions(s) / ED Diagnoses   Final diagnoses:  Medication reaction, initial encounter  Other infective acute otitis externa of both ears    New Prescriptions New Prescriptions   TRAMADOL (ULTRAM) 50 MG TABLET    Take 1 tablet (50 mg total) by mouth every 6 (six) hours as needed.     Margarita Mail, PA-C 06/12/17 1342    Margette Fast, MD 06/12/17 [phone]

## 2017-11-12 ENCOUNTER — Encounter (HOSPITAL_COMMUNITY): Payer: Self-pay | Admitting: Emergency Medicine

## 2017-11-12 ENCOUNTER — Emergency Department (HOSPITAL_COMMUNITY): Payer: Self-pay

## 2017-11-12 ENCOUNTER — Emergency Department (HOSPITAL_COMMUNITY)
Admission: EM | Admit: 2017-11-12 | Discharge: 2017-11-12 | Disposition: A | Discharge status: Home / Self Care | Payer: Self-pay | Attending: Emergency Medicine | Admitting: Emergency Medicine

## 2017-11-12 DIAGNOSIS — I10 Essential (primary) hypertension: Secondary | ICD-10-CM | POA: Insufficient documentation

## 2017-11-12 DIAGNOSIS — M79662 Pain in left lower leg: Secondary | ICD-10-CM | POA: Insufficient documentation

## 2017-11-12 DIAGNOSIS — M79661 Pain in right lower leg: Secondary | ICD-10-CM | POA: Insufficient documentation

## 2017-11-12 DIAGNOSIS — J45909 Unspecified asthma, uncomplicated: Secondary | ICD-10-CM | POA: Insufficient documentation

## 2017-11-12 DIAGNOSIS — E119 Type 2 diabetes mellitus without complications: Secondary | ICD-10-CM | POA: Insufficient documentation

## 2017-11-12 DIAGNOSIS — R2242 Localized swelling, mass and lump, left lower limb: Secondary | ICD-10-CM | POA: Insufficient documentation

## 2017-11-12 DIAGNOSIS — R2241 Localized swelling, mass and lump, right lower limb: Secondary | ICD-10-CM | POA: Insufficient documentation

## 2017-11-12 DIAGNOSIS — M7989 Other specified soft tissue disorders: Secondary | ICD-10-CM

## 2017-11-12 DIAGNOSIS — Z79899 Other long term (current) drug therapy: Secondary | ICD-10-CM | POA: Insufficient documentation

## 2017-11-12 LAB — CBC
HCT: 38 % (ref 36.0–46.0)
Hemoglobin: 12.3 g/dL (ref 12.0–15.0)
MCH: 25.9 pg — AB (ref 26.0–34.0)
MCHC: 32.4 g/dL (ref 30.0–36.0)
MCV: 80.2 fL (ref 78.0–100.0)
PLATELETS: 282 10*3/uL (ref 150–400)
RBC: 4.74 MIL/uL (ref 3.87–5.11)
RDW: 14.4 % (ref 11.5–15.5)
WBC: 6.5 10*3/uL (ref 4.0–10.5)

## 2017-11-12 LAB — BASIC METABOLIC PANEL
Anion gap: 12 (ref 5–15)
BUN: 11 mg/dL (ref 6–20)
CALCIUM: 9.2 mg/dL (ref 8.9–10.3)
CO2: 23 mmol/L (ref 22–32)
CREATININE: 0.83 mg/dL (ref 0.44–1.00)
Chloride: 107 mmol/L (ref 101–111)
GFR calc Af Amer: >60 mL/min (ref >60)
GLUCOSE: 144 mg/dL — AB (ref 65–99)
Potassium: 3.5 mmol/L (ref 3.5–5.1)
SODIUM: 142 mmol/L (ref 135–145)

## 2017-11-12 LAB — I-STAT TROPONIN, ED: TROPONIN I, POC: 0 ng/mL (ref 0.00–0.08)

## 2017-11-12 LAB — BRAIN NATRIURETIC PEPTIDE: B Natriuretic Peptide: 7.1 pg/mL (ref 0.0–100.0)

## 2017-11-12 MED ORDER — DOXYCYCLINE HYCLATE 100 MG PO CAPS: 100.0000 mg | ORAL_CAPSULE | Freq: Two times a day (BID) | ORAL | 0 refills | Status: AC

## 2017-11-12 MED ORDER — DEXAMETHASONE SODIUM PHOSPHATE 10 MG/ML IJ SOLN
10.0000 mg | Freq: Once | INTRAMUSCULAR | Status: AC
  Administered 2017-11-12: 10 mg via INTRAMUSCULAR
  Filled 2017-11-12: qty 1

## 2017-11-12 MED ORDER — KETOROLAC TROMETHAMINE 30 MG/ML IJ SOLN
30.0000 mg | Freq: Once | INTRAMUSCULAR | Status: AC
  Administered 2017-11-12: 30 mg via INTRAMUSCULAR
  Filled 2017-11-12: qty 1

## 2017-11-12 NOTE — ED Notes (Signed)
ED Provider at bedside. 

## 2017-11-12 NOTE — ED Triage Notes (Signed)
Pt c/o bilat lower extremity swelling x 1 week, no hx of same, legs appear tight, pt reports pain increased tonight. Pt denies shob.

## 2017-11-12 NOTE — ED Provider Notes (Signed)
[facility] EMERGENCY DEPARTMENT Provider Note   CSN: [id number] Arrival date & time: 11/12/17  0049     History   Chief Complaint Chief Complaint  Patient presents with  . Leg Swelling    HPI Holly Hopkins is a 47 y.o. female.  Patient presents to the emergency department with a chief complaint of bilateral lower extremity pain and swelling.  She states that she noticed 2 red marks on her medial lower extremities near her ankles yesterday.  She reports associated pain and swelling.  She has not taken anything for her symptoms.  She denies any chest pain or shortness of breath.  Denies any fevers or chills.  She states that she does have some achy joints from time to time.  She denies any other associated symptoms.   The history is provided by the patient. No language interpreter was used.    Past Medical History:  Diagnosis Date  . ADD (attention deficit disorder) 06/11/2015  . Allergy   . Anxiety   . Asthma    allergy related..  . Depression   . H/O tubal ligation 05/15/2003  . History of laparoscopic cholecystectomy 1991  . Hypertension   . Migraine    migraine is aura presenting before seizure.  . 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 Type II or unspecified type diabetes mellitus without mention of complication, not stated as uncontrolled 12/26/2013    Patient Active Problem List   Diagnosis Date Noted  . Obesity, unspecified 12/26/2013  . Type II or unspecified type diabetes mellitus without mention of complication, not stated as uncontrolled 12/26/2013  . Chest pain 10/17/2013  . Pedal edema 10/17/2013  . Menorrhagia 10/17/2013  . Microcytic anemia 10/17/2013  . Seizure ([place])   . 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 PIH (pregnancy induced hypertension) 01/25/2012  . Fibroids 01/25/2012  . Breast mass, left 01/25/2012  . STD (sexually transmitted disease) 01/25/2012  . Insulin resistance  01/25/2012  . Migraine   . ASTHMA 01/17/2007  . BIPOLAR DISORDER 12/07/2006  . Allergic rhinitis, cause unspecified 12/07/2006  . GASTROESOPHAGEAL REFLUX, NO ESOPHAGITIS 12/07/2006    Past Surgical History:  Procedure Laterality Date  . CESAREAN SECTION     x2  . DILITATION & CURRETTAGE/HYSTROSCOPY WITH NOVASURE ABLATION N/A 11/29/2013   Procedure: DILATATION & CURETTAGE/HYSTEROSCOPY WITH  RESECTION;  Surgeon: Ena Dawley, MD;  Location: [facility];  Service: Gynecology;  Laterality: N/A;  . INDUCED ABORTION    . LAPAROSCOPIC CHOLECYSTECTOMY    . TUBAL LIGATION      OB History    Gravida Para Term Preterm AB Living   3 2 2  0 1 2   SAB TAB Ectopic Multiple Live Births   0 0 0 0         Home Medications    Prior to Admission medications   Medication Sig Start Date End Date Taking? Authorizing Provider  albuterol (VENTOLIN HFA) 108 (90 Base) MCG/ACT inhaler INHALE 2 PUFFS INTO THE LUNGS EVERY 4 (FOUR) HOURS AS NEEDED. FOR SHORTNESS OF BREATH 10/06/16   Forrest Moron, MD  amphetamine-dextroamphetamine (ADDERALL) 20 MG tablet Take 20 mg by mouth 2 (two) times daily.    [provider]  benzonatate (TESSALON) 100 MG capsule Take 1 capsule (100 mg total) by mouth 2 (two) times daily as needed for cough. 10/06/16   Forrest Moron, MD  citalopram (CELEXA) 40 MG tablet Take  40 mg by mouth daily.    [provider]  cloNIDine (CATAPRES) 0.2 MG tablet Take 0.2 mg by mouth 2 (two) times daily.    [provider]  ferrous sulfate 325 (65 FE) MG tablet Take 325 mg by mouth 2 (two) times daily with a meal. Reported on 10/28/2015 10/18/13   Delfina Redwood, MD  gabapentin (NEURONTIN) 300 MG capsule Take 300 mg by mouth 3 (three) times daily.    [provider]  Melatonin 5 MG CAPS Take by mouth.    [provider]  meloxicam (MOBIC) 15 MG tablet Take 1 tablet (15 mg total) by mouth daily. 11/08/16   Kirichenko, Tatyana, PA-C  methocarbamol  (ROBAXIN) 500 MG tablet Take 1 tablet (500 mg total) by mouth 2 (two) times daily. 11/08/16   Kirichenko, Lahoma Rocker, PA-C  Multiple Vitamin (MULTIVITAMIN) capsule Take 1 capsule by mouth daily.    [provider]  naproxen (NAPROSYN) 500 MG tablet Take 1 tablet (500 mg total) by mouth 2 (two) times daily with a meal. 03/17/16   Leo Grosser, MD  traMADol (ULTRAM) 50 MG tablet Take 1 tablet (50 mg total) by mouth every 6 (six) hours as needed. 06/12/17   Margarita Mail, PA-C    Family History Family History  Problem Relation Age of Onset  . Diabetes Mother   . Heart disease Mother   . Diabetes Father   . Cancer Father        THYROID  . Hypertension Father   . Hyperlipidemia Father   . Mental illness Brother        PSTD,DEPRESSION  . Migraines Son   . Allergy (severe) Son   . Diabetes Maternal Aunt   . Goiter Maternal Uncle   . Diabetes Paternal Aunt   . Alcohol abuse Paternal Uncle        ALCOHOL AND DRUGS  . Heart disease Maternal Grandmother   . Diabetes Maternal Grandmother   . Hypertension Maternal Grandmother   . Diabetes Maternal Grandfather   . Heart disease Maternal Grandfather   . Cancer Maternal Grandfather        QUESTION WHAT KIND  . Heart disease Paternal Grandmother   . Diabetes Paternal Grandmother   . Hypertension Paternal Grandmother   . Cancer Paternal Grandmother        BREAST  . Migraines Son   . Allergy (severe) Son   . Heart disease Maternal Aunt   . Diabetes Paternal Aunt   . Arthritis Paternal Aunt     Social History Social History   Tobacco Use  . Smoking status: Never Smoker  . Smokeless tobacco: Never Used  Substance Use Topics  . Alcohol use: Yes    Comment: occ  . Drug use: No     Allergies   Patient has no known allergies.   Review of Systems Review of Systems  All other systems reviewed and are negative.    Physical Exam Updated Vital Signs BP 126/76   Pulse 81   Temp 98.9 F (37.2 C) (Oral)   Resp 16   Ht 5'  3" (1.6 m)   Wt 113.4 kg (250 lb)   SpO2 97%   BMI 44.29 kg/m   Physical Exam  Constitutional: She is oriented to person, place, and time. She appears well-developed and well-nourished.  HENT:  Head: Normocephalic and atraumatic.  Eyes: Conjunctivae and EOM are normal. Pupils are equal, round, and reactive to light.  Neck: Normal range of motion. Neck supple.  Cardiovascular: Normal rate and regular rhythm. Exam reveals no gallop and no friction rub.  No murmur heard. Pulmonary/Chest: Effort normal and breath sounds normal. No respiratory distress. She has no wheezes. She has no rales. She exhibits no tenderness.  Abdominal: Soft. Bowel sounds are normal. She exhibits no distension and no mass. There is no tenderness. There is no rebound and no guarding.  Musculoskeletal: Normal range of motion. She exhibits no edema or tenderness.  Neurological: She is alert and oriented to person, place, and time.  Skin: Skin is warm and dry.  Erythema around bilateral lower extremities as pictured below, no abscess  Psychiatric: She has a normal mood and affect. Her behavior is normal. Judgment and thought content normal.  Nursing note and vitals reviewed.      ED Treatments / Results  Labs (all labs ordered are listed, but only abnormal results are displayed) Labs Reviewed  CBC - Abnormal; Notable for the following components:      Result Value   MCH 25.9 (*)    All other components within normal limits  BASIC METABOLIC PANEL - Abnormal; Notable for the following components:   Glucose, Bld 144 (*)    All other components within normal limits  BRAIN NATRIURETIC PEPTIDE  I-STAT TROPONIN, ED    EKG  EKG Interpretation None       Radiology Dg Chest 2 View  Result Date: 11/12/2017 CLINICAL DATA:  Bilateral lower extremity edema. EXAM: CHEST  2 VIEW COMPARISON:  Radiographs of February 05, 2016. FINDINGS: The heart size and mediastinal contours are within normal limits. Both lungs are  clear. No pneumothorax or pleural effusion is noted. The visualized skeletal structures are unremarkable. IMPRESSION: No active cardiopulmonary disease. Electronically Signed   By: Marijo Conception, M.D.   On: 11/12/2017 01:28    Procedures Procedures (including critical care time)  Medications Ordered in ED Medications - No data to display   Initial Impression / Assessment and Plan / ED Course  I have reviewed the triage vital signs and the nursing notes.  Pertinent labs & imaging results that were available during my care of the patient were reviewed by me and considered in my medical decision making (see chart for details).     Patient with erythema and swelling of her bilateral lower extremities.  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  Patient has had some achy joints, will refer to PCP for continued workup.  Will also cover for panniculitis with doxycycline.  Final Clinical Impressions(s) / ED Diagnoses   Final diagnoses:  Leg swelling    ED Discharge Orders        Ordered    doxycycline (VIBRAMYCIN) 100 MG capsule  2 times daily     11/12/17 0420       Mariaceleste, Herrera, PA-C 11/12/17 Karn Pickler, April, MD 11/12/17 [phone]

## 2018-09-13 ENCOUNTER — Emergency Department (HOSPITAL_BASED_OUTPATIENT_CLINIC_OR_DEPARTMENT_OTHER): Payer: Self-pay

## 2018-09-13 ENCOUNTER — Emergency Department (HOSPITAL_BASED_OUTPATIENT_CLINIC_OR_DEPARTMENT_OTHER)
Admission: EM | Admit: 2018-09-13 | Discharge: 2018-09-13 | Disposition: A | Discharge status: Home / Self Care | Payer: Self-pay | Attending: Emergency Medicine | Admitting: Emergency Medicine

## 2018-09-13 ENCOUNTER — Other Ambulatory Visit: Payer: Self-pay

## 2018-09-13 ENCOUNTER — Encounter (HOSPITAL_BASED_OUTPATIENT_CLINIC_OR_DEPARTMENT_OTHER): Payer: Self-pay | Admitting: *Deleted

## 2018-09-13 DIAGNOSIS — E119 Type 2 diabetes mellitus without complications: Secondary | ICD-10-CM | POA: Insufficient documentation

## 2018-09-13 DIAGNOSIS — Y9389 Activity, other specified: Secondary | ICD-10-CM | POA: Insufficient documentation

## 2018-09-13 DIAGNOSIS — S63502A Unspecified sprain of left wrist, initial encounter: Secondary | ICD-10-CM | POA: Insufficient documentation

## 2018-09-13 DIAGNOSIS — F319 Bipolar disorder, unspecified: Secondary | ICD-10-CM | POA: Insufficient documentation

## 2018-09-13 DIAGNOSIS — X509XXA Other and unspecified overexertion or strenuous movements or postures, initial encounter: Secondary | ICD-10-CM | POA: Insufficient documentation

## 2018-09-13 DIAGNOSIS — J45909 Unspecified asthma, uncomplicated: Secondary | ICD-10-CM | POA: Insufficient documentation

## 2018-09-13 DIAGNOSIS — Y92000 Kitchen of unspecified non-institutional (private) residence as  the place of occurrence of the external cause: Secondary | ICD-10-CM | POA: Insufficient documentation

## 2018-09-13 DIAGNOSIS — F909 Attention-deficit hyperactivity disorder, unspecified type: Secondary | ICD-10-CM | POA: Insufficient documentation

## 2018-09-13 DIAGNOSIS — Z9049 Acquired absence of other specified parts of digestive tract: Secondary | ICD-10-CM | POA: Insufficient documentation

## 2018-09-13 DIAGNOSIS — F419 Anxiety disorder, unspecified: Secondary | ICD-10-CM | POA: Insufficient documentation

## 2018-09-13 DIAGNOSIS — I10 Essential (primary) hypertension: Secondary | ICD-10-CM | POA: Insufficient documentation

## 2018-09-13 DIAGNOSIS — Y998 Other external cause status: Secondary | ICD-10-CM | POA: Insufficient documentation

## 2018-09-13 DIAGNOSIS — Z79899 Other long term (current) drug therapy: Secondary | ICD-10-CM | POA: Insufficient documentation

## 2018-09-13 MED ORDER — HYDROCODONE-ACETAMINOPHEN 5-325 MG PO TABS: 1.0000 | ORAL_TABLET | ORAL | 0 refills | Status: AC | PRN

## 2018-09-13 MED ORDER — IBUPROFEN 600 MG PO TABS: 600.0000 mg | ORAL_TABLET | Freq: Four times a day (QID) | ORAL | 0 refills | Status: AC | PRN

## 2018-09-13 MED ORDER — IBUPROFEN 800 MG PO TABS
800.0000 mg | ORAL_TABLET | Freq: Once | ORAL | Status: AC
  Administered 2018-09-13: 800 mg via ORAL
  Filled 2018-09-13: qty 1

## 2018-09-13 NOTE — ED Provider Notes (Signed)
[facility] EMERGENCY DEPARTMENT Provider Note   CSN: [id number] Arrival date & time: 09/13/18  0900     History   Chief Complaint Chief Complaint  Patient presents with  . Wrist Pain    HPI Holly Hopkins is a 47 y.o. female.  Pt presents to the ED today with left wrist pain.  Pt was making herself a sandwich last night and reached around to grab a tomato.  She felt her wrist twist abnormally and felt a pop.  She thought it would be better in the morning, but it hurts more today.  She tried to go to work, but could not do her job, so she was sent here.  She denies any other injuries.     Past Medical History:  Diagnosis Date  . ADD (attention deficit disorder) 06/11/2015  . Allergy   . Anxiety   . Asthma    allergy related..  . Depression   . H/O tubal ligation 05/15/2003  . History of laparoscopic cholecystectomy 1991  . Hypertension   . Migraine    migraine is aura presenting before seizure.  . Seizure ([place])    stress induced; has migraine aura; last seizure in 2010  . Type II or unspecified type diabetes mellitus without mention of complication, not stated as uncontrolled 12/26/2013    Patient Active Problem List   Diagnosis Date Noted  . Obesity, unspecified 12/26/2013  . Type II or unspecified type diabetes mellitus without mention of complication, not stated as uncontrolled 12/26/2013  . Chest pain 10/17/2013  . Pedal edema 10/17/2013  . Menorrhagia 10/17/2013  . Microcytic anemia 10/17/2013  . Seizure ([place])   . 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 PIH (pregnancy induced hypertension) 01/25/2012  . Fibroids 01/25/2012  . Breast mass, left 01/25/2012  . STD (sexually transmitted disease) 01/25/2012  . Insulin resistance 01/25/2012  . Migraine   . ASTHMA 01/17/2007  . BIPOLAR DISORDER 12/07/2006  . Allergic rhinitis, cause unspecified 12/07/2006  . GASTROESOPHAGEAL REFLUX, NO ESOPHAGITIS 12/07/2006    Past Surgical History:    Procedure Laterality Date  . CESAREAN SECTION     x2  . DILITATION & CURRETTAGE/HYSTROSCOPY WITH NOVASURE ABLATION N/A 11/29/2013   Procedure: DILATATION & CURETTAGE/HYSTEROSCOPY WITH  RESECTION;  Surgeon: Ena Dawley, MD;  Location: [facility];  Service: Gynecology;  Laterality: N/A;  . INDUCED ABORTION    . LAPAROSCOPIC CHOLECYSTECTOMY    . TUBAL LIGATION       OB History    Gravida  3   Para  2   Term  2   Preterm  0   AB  1   Living  2     SAB  0   TAB  0   Ectopic  0   Multiple  0   Live Births               Home Medications    Prior to Admission medications   Medication Sig Start Date End Date Taking? Authorizing Provider  albuterol (VENTOLIN HFA) 108 (90 Base) MCG/ACT inhaler INHALE 2 PUFFS INTO THE LUNGS EVERY 4 (FOUR) HOURS AS NEEDED. FOR SHORTNESS OF BREATH 10/06/16   Forrest Moron, MD  amphetamine-dextroamphetamine (ADDERALL) 20 MG tablet Take 20 mg by mouth 2 (two) times daily.    [provider]  benzonatate (TESSALON) 100 MG capsule Take 1 capsule (100 mg total) by mouth 2 (two) times daily as needed for cough. 10/06/16   Forrest Moron,  MD  citalopram (CELEXA) 40 MG tablet Take 40 mg by mouth daily.    [provider]  cloNIDine (CATAPRES) 0.2 MG tablet Take 0.2 mg by mouth 2 (two) times daily.    [provider]  doxycycline (VIBRAMYCIN) 100 MG capsule Take 1 capsule (100 mg total) by mouth 2 (two) times daily. 11/12/17   Montine Circle, PA-C  ferrous sulfate 325 (65 FE) MG tablet Take 325 mg by mouth 2 (two) times daily with a meal. Reported on 10/28/2015 10/18/13   Delfina Redwood, MD  gabapentin (NEURONTIN) 300 MG capsule Take 300 mg by mouth 3 (three) times daily.    [provider]  HYDROcodone-acetaminophen (NORCO/VICODIN) 5-325 MG tablet Take 1 tablet by mouth every 4 (four) hours as needed. 09/13/18   Isla Pence, MD  ibuprofen (ADVIL,MOTRIN) 600 MG tablet Take 1 tablet (600 mg total) by mouth  every 6 (six) hours as needed. 09/13/18   Isla Pence, MD  Melatonin 5 MG CAPS Take by mouth.    [provider]  meloxicam (MOBIC) 15 MG tablet Take 1 tablet (15 mg total) by mouth daily. 11/08/16   Kirichenko, Tatyana, PA-C  methocarbamol (ROBAXIN) 500 MG tablet Take 1 tablet (500 mg total) by mouth 2 (two) times daily. 11/08/16   Kirichenko, Lahoma Rocker, PA-C  Multiple Vitamin (MULTIVITAMIN) capsule Take 1 capsule by mouth daily.    [provider]  naproxen (NAPROSYN) 500 MG tablet Take 1 tablet (500 mg total) by mouth 2 (two) times daily with a meal. 03/17/16   Leo Grosser, MD  traMADol (ULTRAM) 50 MG tablet Take 1 tablet (50 mg total) by mouth every 6 (six) hours as needed. 06/12/17   Margarita Mail, PA-C    Family History Family History  Problem Relation Age of Onset  . Diabetes Mother   . Heart disease Mother   . Diabetes Father   . Cancer Father        THYROID  . Hypertension Father   . Hyperlipidemia Father   . Mental illness Brother        PSTD,DEPRESSION  . Migraines Son   . Allergy (severe) Son   . Diabetes Maternal Aunt   . Goiter Maternal Uncle   . Diabetes Paternal Aunt   . Alcohol abuse Paternal Uncle        ALCOHOL AND DRUGS  . Heart disease Maternal Grandmother   . Diabetes Maternal Grandmother   . Hypertension Maternal Grandmother   . Diabetes Maternal Grandfather   . Heart disease Maternal Grandfather   . Cancer Maternal Grandfather        QUESTION WHAT KIND  . Heart disease Paternal Grandmother   . Diabetes Paternal Grandmother   . Hypertension Paternal Grandmother   . Cancer Paternal Grandmother        BREAST  . Migraines Son   . Allergy (severe) Son   . Heart disease Maternal Aunt   . Diabetes Paternal Aunt   . Arthritis Paternal Aunt     Social History Social History   Tobacco Use  . Smoking status: Never Smoker  . Smokeless tobacco: Never Used  Substance Use Topics  . Alcohol use: Yes    Comment: occ  . Drug use: No      Allergies   Patient has no known allergies.   Review of Systems Review of Systems  Musculoskeletal:       Left wrist pain  All other systems reviewed and are negative.    Physical Exam Updated  Vital Signs BP 128/80 (BP Location: Right Arm)   Pulse 87   Temp 98.2 F (36.8 C) (Oral)   Resp 18   Ht 5\' 4"  (1.626 m)   Wt 104.3 kg   LMP 09/06/2018   SpO2 96%   BMI 39.48 kg/m   Physical Exam  Constitutional: She is oriented to person, place, and time. She appears well-developed and well-nourished.  HENT:  Head: Normocephalic and atraumatic.  Right Ear: External ear normal.  Left Ear: External ear normal.  Nose: Nose normal.  Mouth/Throat: Oropharynx is clear and moist.  Eyes: Pupils are equal, round, and reactive to light. Conjunctivae and EOM are normal.  Neck: Normal range of motion. Neck supple.  Cardiovascular: Normal rate, regular rhythm, normal heart sounds and intact distal pulses.  Pulmonary/Chest: Effort normal and breath sounds normal.  Abdominal: Soft. Bowel sounds are normal.  Musculoskeletal:       Left wrist: She exhibits tenderness.  Neurological: She is alert and oriented to person, place, and time.  Skin: Skin is warm. Capillary refill takes less than 2 seconds.  Psychiatric: She has a normal mood and affect. Her behavior is normal. Judgment and thought content normal.  Nursing note and vitals reviewed.    ED Treatments / Results  Labs (all labs ordered are listed, but only abnormal results are displayed) Labs Reviewed - No data to display  EKG None  Radiology Dg Wrist Complete Left  Result Date: 09/13/2018 CLINICAL DATA:  Twisting injury to the LEFT wrist last night with an audible pop. Persistent MEDIAL (ulnar) wrist pain. Initial encounter. EXAM: LEFT WRIST - COMPLETE 3+ VIEW COMPARISON:  04/08/2016. FINDINGS: No evidence of acute fracture or dislocation. Joint spaces well preserved. Well-preserved bone mineral density. No intrinsic  osseous abnormalities. No interval change. IMPRESSION: Normal examination. Electronically Signed   By: Evangeline Dakin M.D.   On: 09/13/2018 09:59    Procedures Procedures (including critical care time)  Medications Ordered in ED Medications  ibuprofen (ADVIL,MOTRIN) tablet 800 mg (800 mg Oral Given 09/13/18 0940)     Initial Impression / Assessment and Plan / ED Course  I have reviewed the triage vital signs and the nursing notes.  Pertinent labs & imaging results that were available during my care of the patient were reviewed by me and considered in my medical decision making (see chart for details).     No fracture on xray, likely a sprain.  Pt will be placed in a velcro wrist splint.  She is given the number of Dr. Fredna Dow to f/u if wrist pain does not resolve.  Return if worse.  Final Clinical Impressions(s) / ED Diagnoses   Final diagnoses:  Sprain of left wrist, initial encounter    ED Discharge Orders         Ordered    ibuprofen (ADVIL,MOTRIN) 600 MG tablet  Every 6 hours PRN     09/13/18 1015    HYDROcodone-acetaminophen (NORCO/VICODIN) 5-325 MG tablet  Every 4 hours PRN     09/13/18 1015           Isla Pence, MD 09/13/18 1017

## 2018-09-13 NOTE — ED Triage Notes (Signed)
Pt reports twisting her wrist last night, with ongoing pain and swelling. + radial pulse, able to move fingers.

## 2020-01-19 ENCOUNTER — Emergency Department (HOSPITAL_BASED_OUTPATIENT_CLINIC_OR_DEPARTMENT_OTHER)
Admission: EM | Admit: 2020-01-19 | Discharge: 2020-01-19 | Disposition: A | Discharge status: Home / Self Care | Payer: BC Managed Care – PPO | Attending: Emergency Medicine | Admitting: Emergency Medicine

## 2020-01-19 ENCOUNTER — Encounter (HOSPITAL_BASED_OUTPATIENT_CLINIC_OR_DEPARTMENT_OTHER): Payer: Self-pay | Admitting: Emergency Medicine

## 2020-01-19 ENCOUNTER — Other Ambulatory Visit: Payer: Self-pay

## 2020-01-19 DIAGNOSIS — S8012XA Contusion of left lower leg, initial encounter: Secondary | ICD-10-CM | POA: Diagnosis not present

## 2020-01-19 DIAGNOSIS — Y999 Unspecified external cause status: Secondary | ICD-10-CM | POA: Diagnosis not present

## 2020-01-19 DIAGNOSIS — Z79899 Other long term (current) drug therapy: Secondary | ICD-10-CM | POA: Diagnosis not present

## 2020-01-19 DIAGNOSIS — T148XXA Other injury of unspecified body region, initial encounter: Secondary | ICD-10-CM

## 2020-01-19 DIAGNOSIS — Y9389 Activity, other specified: Secondary | ICD-10-CM | POA: Insufficient documentation

## 2020-01-19 DIAGNOSIS — Y9289 Other specified places as the place of occurrence of the external cause: Secondary | ICD-10-CM | POA: Diagnosis not present

## 2020-01-19 DIAGNOSIS — M79605 Pain in left leg: Secondary | ICD-10-CM | POA: Diagnosis present

## 2020-01-19 DIAGNOSIS — J45909 Unspecified asthma, uncomplicated: Secondary | ICD-10-CM | POA: Diagnosis not present

## 2020-01-19 DIAGNOSIS — X58XXXA Exposure to other specified factors, initial encounter: Secondary | ICD-10-CM | POA: Diagnosis not present

## 2020-01-19 DIAGNOSIS — E119 Type 2 diabetes mellitus without complications: Secondary | ICD-10-CM | POA: Insufficient documentation

## 2020-01-19 LAB — CBC
HCT: 39.1 % (ref 36.0–46.0)
Hemoglobin: 12.4 g/dL (ref 12.0–15.0)
MCH: 25.7 pg — ABNORMAL LOW (ref 26.0–34.0)
MCHC: 31.7 g/dL (ref 30.0–36.0)
MCV: 81 fL (ref 80.0–100.0)
Platelets: 259 10*3/uL (ref 150–400)
RBC: 4.83 MIL/uL (ref 3.87–5.11)
RDW: 14.4 % (ref 11.5–15.5)
WBC: 7.2 10*3/uL (ref 4.0–10.5)
nRBC: 0 % (ref 0.0–0.2)

## 2020-01-19 LAB — D-DIMER, QUANTITATIVE: D-Dimer, Quant: 0.4 ug/mL-FEU (ref 0.00–0.50)

## 2020-01-19 MED ORDER — ACETAMINOPHEN 500 MG PO TABS
1000.0000 mg | ORAL_TABLET | Freq: Once | ORAL | Status: AC
Start: 2020-01-19 — End: 2020-01-19
  Administered 2020-01-19: 05:00:00 1000 mg via ORAL
  Filled 2020-01-19: qty 2

## 2020-01-19 MED ORDER — IBUPROFEN 800 MG PO TABS
800.0000 mg | ORAL_TABLET | Freq: Once | ORAL | Status: AC
  Administered 2020-01-19: 05:00:00 800 mg via ORAL
  Filled 2020-01-19: qty 1

## 2020-01-19 NOTE — ED Triage Notes (Signed)
Pt reports bruise on left calf starting this past Tues, started as small black spot but woke this morning and has grown. Pt reports pain in groin area bilaterally.Pt took Ibuprofen with no relief

## 2020-01-19 NOTE — ED Notes (Signed)
Pt now complaining of left arm pain worse with movement. EDP made aware.

## 2020-01-19 NOTE — ED Provider Notes (Signed)
[facility] EMERGENCY DEPARTMENT Provider Note   CSN: [id number] Arrival date & time: 01/19/20  0319     History Chief Complaint  Patient presents with  . Leg Pain    Holly Hopkins is a 49 y.o. female.  Patient presents to the emergency department for evaluation of bruising on her left leg.  She first noticed it earlier this week.  It started as a small area but tonight she noticed that it had enlarged.  The area has been painful and she has been noticing some tingling.  Pain sometimes travels up to the groin.  She is unaware of any injury that could have caused the bruising.  She has not noticed any other bruising on her body.  She has not had any unusual bleeding.        Past Medical History:  Diagnosis Date  . ADD (attention deficit disorder) 06/11/2015  . Allergy   . Anxiety   . Asthma    allergy related..  . Depression   . H/O tubal ligation 05/15/2003  . History of laparoscopic cholecystectomy 1991  . Hypertension    pregnancy related   . Migraine    migraine is aura presenting before seizure.  . 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 Type II or unspecified type diabetes mellitus without mention of complication, not stated as uncontrolled 12/26/2013    Patient Active Problem List   Diagnosis Date Noted  . Obesity, unspecified 12/26/2013  . Type II or unspecified type diabetes mellitus without mention of complication, not stated as uncontrolled 12/26/2013  . Chest pain 10/17/2013  . Pedal edema 10/17/2013  . Menorrhagia 10/17/2013  . Microcytic anemia 10/17/2013  . Seizure ([place])   . 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 PIH (pregnancy induced hypertension) 01/25/2012  . Fibroids 01/25/2012  . Breast mass, left 01/25/2012  . STD (sexually transmitted disease) 01/25/2012  . Insulin resistance 01/25/2012  . Migraine   . ASTHMA 01/17/2007  . BIPOLAR DISORDER 12/07/2006  . Allergic rhinitis, cause  unspecified 12/07/2006  . GASTROESOPHAGEAL REFLUX, NO ESOPHAGITIS 12/07/2006    Past Surgical History:  Procedure Laterality Date  . CESAREAN SECTION     x2  . DILITATION & CURRETTAGE/HYSTROSCOPY WITH NOVASURE ABLATION N/A 11/29/2013   Procedure: DILATATION & CURETTAGE/HYSTEROSCOPY WITH  RESECTION;  Surgeon: Ena Dawley, MD;  Location: [facility];  Service: Gynecology;  Laterality: N/A;  . INDUCED ABORTION    . LAPAROSCOPIC CHOLECYSTECTOMY    . TUBAL LIGATION       OB History    Gravida  3   Para  2   Term  2   Preterm  0   AB  1   Living  2     SAB  0   TAB  0   Ectopic  0   Multiple  0   Live Births              Family History  Problem Relation Age of Onset  . Diabetes Mother   . Heart disease Mother   . Diabetes Father   . Cancer Father        THYROID  . Hypertension Father   . Hyperlipidemia Father   . Mental illness Brother        PSTD,DEPRESSION  . Migraines Son   . Allergy (severe) Son   . Diabetes Maternal Aunt   . Goiter Maternal Uncle   . Diabetes Paternal  Aunt   . Alcohol abuse Paternal Uncle        ALCOHOL AND DRUGS  . Heart disease Maternal Grandmother   . Diabetes Maternal Grandmother   . Hypertension Maternal Grandmother   . Diabetes Maternal Grandfather   . Heart disease Maternal Grandfather   . Cancer Maternal Grandfather        QUESTION WHAT KIND  . Heart disease Paternal Grandmother   . Diabetes Paternal Grandmother   . Hypertension Paternal Grandmother   . Cancer Paternal Grandmother        BREAST  . Migraines Son   . Allergy (severe) Son   . Heart disease Maternal Aunt   . Diabetes Paternal Aunt   . Arthritis Paternal Aunt     Social History   Tobacco Use  . Smoking status: Never Smoker  . Smokeless tobacco: Never Used  Substance Use Topics  . Alcohol use: Not Currently    Comment: occ  . Drug use: No    Home Medications Prior to Admission medications   Medication Sig Start Date End Date Taking?  Authorizing Provider  amphetamine-dextroamphetamine (ADDERALL) 20 MG tablet Take 20 mg by mouth 2 (two) times daily.   Yes [provider]  citalopram (CELEXA) 40 MG tablet Take 40 mg by mouth daily.   Yes [provider]  cloNIDine (CATAPRES) 0.2 MG tablet Take 0.2 mg by mouth 2 (two) times daily.   Yes [provider]  ergocalciferol (VITAMIN D2) 1.25 MG (50000 UT) capsule Take by mouth. 08/04/17  Yes [provider]  gabapentin (NEURONTIN) 300 MG capsule Take 300 mg by mouth 3 (three) times daily.   Yes [provider]  ibuprofen (ADVIL,MOTRIN) 600 MG tablet Take 1 tablet (600 mg total) by mouth every 6 (six) hours as needed. 09/13/18  Yes Isla Pence, MD  Melatonin 5 MG CAPS Take by mouth.   Yes [provider]  Multiple Vitamin (MULTIVITAMIN) capsule Take 1 capsule by mouth daily.   Yes [provider]  naproxen (NAPROSYN) 500 MG tablet Take 1 tablet (500 mg total) by mouth 2 (two) times daily with a meal. 03/17/16  Yes Leo Grosser, MD  albuterol (VENTOLIN HFA) 108 (90 Base) MCG/ACT inhaler INHALE 2 PUFFS INTO THE LUNGS EVERY 4 (FOUR) HOURS AS NEEDED. FOR SHORTNESS OF BREATH 10/06/16   Forrest Moron, MD  benzonatate (TESSALON) 100 MG capsule Take 1 capsule (100 mg total) by mouth 2 (two) times daily as needed for cough. 10/06/16   Forrest Moron, MD  doxycycline (VIBRAMYCIN) 100 MG capsule Take 1 capsule (100 mg total) by mouth 2 (two) times daily. 11/12/17   Montine Circle, PA-C  ferrous sulfate 325 (65 FE) MG tablet Take 325 mg by mouth 2 (two) times daily with a meal. Reported on 10/28/2015 10/18/13   Delfina Redwood, MD  HYDROcodone-acetaminophen (NORCO/VICODIN) 5-325 MG tablet Take 1 tablet by mouth every 4 (four) hours as needed. 09/13/18   Isla Pence, MD  meloxicam (MOBIC) 15 MG tablet Take 1 tablet (15 mg total) by mouth daily. 11/08/16   Kirichenko, Tatyana, PA-C  methocarbamol (ROBAXIN) 500 MG tablet Take 1  tablet (500 mg total) by mouth 2 (two) times daily. 11/08/16   Kirichenko, Tatyana, PA-C  traMADol (ULTRAM) 50 MG tablet Take 1 tablet (50 mg total) by mouth every 6 (six) hours as needed. 06/12/17   Margarita Mail, PA-C    Allergies    Neomycin  Review of Systems   Review of Systems  Respiratory: Negative for shortness of breath.   Cardiovascular: Negative for chest pain.  Skin: Positive for color change.  All other systems reviewed and are negative.   Physical Exam Updated Vital Signs BP (!) 145/90 (BP Location: Right Arm)   Pulse 87   Temp 98.5 F (36.9 C) (Oral)   Resp 16   Ht 5\' 4"  (1.626 m)   Wt 122.5 kg   SpO2 99%   BMI 46.35 kg/m   Physical Exam Vitals and nursing note reviewed.  Constitutional:      General: She is not in acute distress.    Appearance: Normal appearance. She is well-developed.  HENT:     Head: Normocephalic and atraumatic.     Right Ear: Hearing normal.     Left Ear: Hearing normal.     Nose: Nose normal.  Eyes:     Conjunctiva/sclera: Conjunctivae normal.     Pupils: Pupils are equal, round, and reactive to light.  Cardiovascular:     Rate and Rhythm: Regular rhythm.     Heart sounds: S1 normal and S2 normal. No murmur. No friction rub. No gallop.   Pulmonary:     Effort: Pulmonary effort is normal. No respiratory distress.     Breath sounds: Normal breath sounds.  Chest:     Chest wall: No tenderness.  Abdominal:     General: Bowel sounds are normal.     Palpations: Abdomen is soft.     Tenderness: There is no abdominal tenderness. There is no guarding or rebound. Negative signs include Murphy's sign and McBurney's sign.     Hernia: No hernia is present.  Musculoskeletal:        General: Normal range of motion.     Cervical back: Normal range of motion and neck supple.  Skin:    General: Skin is warm and dry.     Findings: Bruising (5 cm area medial aspect of left calf) present. No rash.  Neurological:     Mental Status: She is  alert and oriented to person, place, and time.     GCS: GCS eye subscore is 4. GCS verbal subscore is 5. GCS motor subscore is 6.     Cranial Nerves: No cranial nerve deficit.     Sensory: No sensory deficit.     Coordination: Coordination normal.  Psychiatric:        Speech: Speech normal.        Behavior: Behavior normal.        Thought Content: Thought content normal.     ED Results / Procedures / Treatments   Labs (all labs ordered are listed, but only abnormal results are displayed) Labs Reviewed  CBC - Abnormal; Notable for the following components:      Result Value   MCH 25.7 (*)    All other components within normal limits  D-DIMER, QUANTITATIVE (NOT AT [facility])    EKG None  Radiology No results found.  Procedures Procedures (including critical care time)  Medications Ordered in ED Medications - No data to display  ED Course  I have reviewed the triage vital signs and the nursing notes.  Pertinent labs & imaging results that were available during my care of the patient were reviewed by me and considered in my medical decision making (see chart for details).    MDM Rules/Calculators/A&P                      Patient presents to the emergency department  for evaluation of bruising to the left leg.  She is unaware of any injury causing the bruise.  The area is small, nonraised but tender.  No sign of infection.  Normal distal pulses.  Blood counts are normal including platelets.  D-dimer is normal.  No further work-up necessary.  Final Clinical Impression(s) / ED Diagnoses Final diagnoses:  Bruise    Rx / DC Orders ED Discharge Orders    None       Venezia Sargeant, Gwenyth Allegra, MD 01/19/20 0500

## 2020-09-15 ENCOUNTER — Emergency Department (HOSPITAL_BASED_OUTPATIENT_CLINIC_OR_DEPARTMENT_OTHER): Payer: BC Managed Care – PPO

## 2020-09-15 ENCOUNTER — Encounter (HOSPITAL_BASED_OUTPATIENT_CLINIC_OR_DEPARTMENT_OTHER): Payer: Self-pay

## 2020-09-15 ENCOUNTER — Emergency Department (HOSPITAL_BASED_OUTPATIENT_CLINIC_OR_DEPARTMENT_OTHER)
Admission: EM | Admit: 2020-09-15 | Discharge: 2020-09-15 | Disposition: A | Discharge status: Home / Self Care | Payer: BC Managed Care – PPO | Attending: Emergency Medicine | Admitting: Emergency Medicine

## 2020-09-15 ENCOUNTER — Other Ambulatory Visit: Payer: Self-pay

## 2020-09-15 DIAGNOSIS — J45909 Unspecified asthma, uncomplicated: Secondary | ICD-10-CM | POA: Diagnosis not present

## 2020-09-15 DIAGNOSIS — Z20822 Contact with and (suspected) exposure to covid-19: Secondary | ICD-10-CM | POA: Insufficient documentation

## 2020-09-15 DIAGNOSIS — E119 Type 2 diabetes mellitus without complications: Secondary | ICD-10-CM | POA: Diagnosis not present

## 2020-09-15 DIAGNOSIS — R6883 Chills (without fever): Secondary | ICD-10-CM | POA: Insufficient documentation

## 2020-09-15 DIAGNOSIS — R059 Cough, unspecified: Secondary | ICD-10-CM | POA: Insufficient documentation

## 2020-09-15 DIAGNOSIS — I10 Essential (primary) hypertension: Secondary | ICD-10-CM | POA: Diagnosis not present

## 2020-09-15 DIAGNOSIS — R0981 Nasal congestion: Secondary | ICD-10-CM | POA: Insufficient documentation

## 2020-09-15 DIAGNOSIS — R112 Nausea with vomiting, unspecified: Secondary | ICD-10-CM | POA: Insufficient documentation

## 2020-09-15 DIAGNOSIS — R519 Headache, unspecified: Secondary | ICD-10-CM | POA: Diagnosis present

## 2020-09-15 DIAGNOSIS — Z79899 Other long term (current) drug therapy: Secondary | ICD-10-CM | POA: Insufficient documentation

## 2020-09-15 DIAGNOSIS — M791 Myalgia, unspecified site: Secondary | ICD-10-CM | POA: Insufficient documentation

## 2020-09-15 DIAGNOSIS — J111 Influenza due to unidentified influenza virus with other respiratory manifestations: Secondary | ICD-10-CM

## 2020-09-15 LAB — CBC WITH DIFFERENTIAL/PLATELET
Abs Immature Granulocytes: 0.02 10*3/uL (ref 0.00–0.07)
Basophils Absolute: 0.1 10*3/uL (ref 0.0–0.1)
Basophils Relative: 1 %
Eosinophils Absolute: 0.5 10*3/uL (ref 0.0–0.5)
Eosinophils Relative: 7 %
HCT: 43.7 % (ref 36.0–46.0)
Hemoglobin: 13.9 g/dL (ref 12.0–15.0)
Immature Granulocytes: 0 %
Lymphocytes Relative: 36 %
Lymphs Abs: 2.5 10*3/uL (ref 0.7–4.0)
MCH: 25.6 pg — ABNORMAL LOW (ref 26.0–34.0)
MCHC: 31.8 g/dL (ref 30.0–36.0)
MCV: 80.3 fL (ref 80.0–100.0)
Monocytes Absolute: 0.7 10*3/uL (ref 0.1–1.0)
Monocytes Relative: 9 %
Neutro Abs: 3.2 10*3/uL (ref 1.7–7.7)
Neutrophils Relative %: 47 %
Platelets: 266 10*3/uL (ref 150–400)
RBC: 5.44 MIL/uL — ABNORMAL HIGH (ref 3.87–5.11)
RDW: 13.3 % (ref 11.5–15.5)
WBC: 6.9 10*3/uL (ref 4.0–10.5)
nRBC: 0 % (ref 0.0–0.2)

## 2020-09-15 LAB — BASIC METABOLIC PANEL
Anion gap: 10 (ref 5–15)
BUN: 15 mg/dL (ref 6–20)
CO2: 24 mmol/L (ref 22–32)
Calcium: 9 mg/dL (ref 8.9–10.3)
Chloride: 104 mmol/L (ref 98–111)
Creatinine, Ser: 0.69 mg/dL (ref 0.44–1.00)
GFR, Estimated: >60 mL/min (ref >60)
Glucose, Bld: 93 mg/dL (ref 70–99)
Potassium: 3.7 mmol/L (ref 3.5–5.1)
Sodium: 138 mmol/L (ref 135–145)

## 2020-09-15 LAB — URINALYSIS, ROUTINE W REFLEX MICROSCOPIC
Bilirubin Urine: NEGATIVE
Glucose, UA: NEGATIVE mg/dL
Hgb urine dipstick: NEGATIVE
Ketones, ur: NEGATIVE mg/dL
Nitrite: NEGATIVE
Protein, ur: NEGATIVE mg/dL
Specific Gravity, Urine: 1.025 (ref 1.005–1.030)
pH: 5.5 (ref 5.0–8.0)

## 2020-09-15 LAB — RESP PANEL BY RT-PCR (FLU A&B, COVID) ARPGX2
Influenza A by PCR: NEGATIVE
Influenza B by PCR: NEGATIVE
SARS Coronavirus 2 by RT PCR: NEGATIVE

## 2020-09-15 LAB — URINALYSIS, MICROSCOPIC (REFLEX)

## 2020-09-15 MED ORDER — SODIUM CHLORIDE 0.9 % IV BOLUS
500.0000 mL | Freq: Once | INTRAVENOUS | Status: AC
  Administered 2020-09-15: 500 mL via INTRAVENOUS

## 2020-09-15 MED ORDER — ONDANSETRON 4 MG PO TBDP: ORAL_TABLET | ORAL | 0 refills | Status: DC

## 2020-09-15 MED ORDER — ONDANSETRON HCL 4 MG/2ML IJ SOLN
4.0000 mg | Freq: Once | INTRAMUSCULAR | Status: AC
  Administered 2020-09-15: 4 mg via INTRAVENOUS
  Filled 2020-09-15: qty 2

## 2020-09-15 NOTE — ED Triage Notes (Signed)
Pt complaint of N/V since Friday with HA, pt complaint of no taste since Saturday.

## 2020-09-15 NOTE — ED Provider Notes (Signed)
[facility]ER[facility]ENC[facility] DEPARTMENT Provider Note   CSN: [id number] Arrival date & time: 09/15/20  0757     History Chief Complaint  Patient presents with  . Shortness of Breath    Holly Hopkins is a 49 y.o. female.  Patient is a 49 year old female with a history of seizures, diabetes, depression and migraines who presents with flulike symptoms.  She says she has had the symptoms for the last 4 to 5 days.  She has had a bifrontal intermittent headache associated with chills, myalgias, mild runny nose and mild coughing.  She is also had nausea and vomiting although she has been intermittently able to keep down some fluids.  She has had some diarrhea.  She reports a little bit of shortness of breath but only with exertion.  No associated chest pain or tightness.  She recently has lost her sense of taste and smell over the last 2 days.  She has had subjective fevers but thinks that her thermometer is not working.  She has no known sick contacts.  No urinary symptoms.  She is fully vaccinated for Covid but has not yet gotten her booster shot.        Past Medical History:  Diagnosis Date  . ADD (attention deficit disorder) 06/11/2015  . Allergy   . Anxiety   . Asthma    allergy related..  . Depression   . H/O tubal ligation 05/15/2003  . History of laparoscopic cholecystectomy 1991  . Hypertension    pregnancy related   . Migraine    migraine is aura presenting before seizure.  . Seizure ([place])    stress induced; has migraine aura; last seizure in 2010  . Type II or unspecified type diabetes mellitus without mention of complication, not stated as uncontrolled 12/26/2013    Patient Active Problem List   Diagnosis Date Noted  . Obesity, unspecified 12/26/2013  . Type II or unspecified type diabetes mellitus without mention of complication, not stated as uncontrolled 12/26/2013  . Chest pain 10/17/2013  . Pedal edema 10/17/2013  . Menorrhagia 10/17/2013  . Microcytic  anemia 10/17/2013  . Seizure ([place])   . 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 PIH (pregnancy induced hypertension) 01/25/2012  . Fibroids 01/25/2012  . Breast mass, left 01/25/2012  . STD (sexually transmitted disease) 01/25/2012  . Insulin resistance 01/25/2012  . Migraine   . ASTHMA 01/17/2007  . BIPOLAR DISORDER 12/07/2006  . Allergic rhinitis, cause unspecified 12/07/2006  . GASTROESOPHAGEAL REFLUX, NO ESOPHAGITIS 12/07/2006    Past Surgical History:  Procedure Laterality Date  . CESAREAN SECTION     x2  . DILITATION & CURRETTAGE/HYSTROSCOPY WITH NOVASURE ABLATION N/A 11/29/2013   Procedure: DILATATION & CURETTAGE/HYSTEROSCOPY WITH  RESECTION;  Surgeon: Ena Dawley, MD;  Location: [facility];  Service: Gynecology;  Laterality: N/A;  . INDUCED ABORTION    . LAPAROSCOPIC CHOLECYSTECTOMY    . TUBAL LIGATION       OB History    Gravida  3   Para  2   Term  2   Preterm  0   AB  1   Living  2     SAB  0   TAB  0   Ectopic  0   Multiple  0   Live Births              Family History  Problem Relation Age of Onset  . Diabetes Mother   . Heart disease Mother   .  Diabetes Father   . Cancer Father        THYROID  . Hypertension Father   . Hyperlipidemia Father   . Mental illness Brother        PSTD,DEPRESSION  . Migraines Son   . Allergy (severe) Son   . Diabetes Maternal Aunt   . Goiter Maternal Uncle   . Diabetes Paternal Aunt   . Alcohol abuse Paternal Uncle        ALCOHOL AND DRUGS  . Heart disease Maternal Grandmother   . Diabetes Maternal Grandmother   . Hypertension Maternal Grandmother   . Diabetes Maternal Grandfather   . Heart disease Maternal Grandfather   . Cancer Maternal Grandfather        QUESTION WHAT KIND  . Heart disease Paternal Grandmother   . Diabetes Paternal Grandmother   . Hypertension Paternal Grandmother   . Cancer Paternal Grandmother        BREAST  . Migraines Son   . Allergy (severe) Son   . Heart  disease Maternal Aunt   . Diabetes Paternal Aunt   . Arthritis Paternal Aunt     Social History   Tobacco Use  . Smoking status: Never Smoker  . Smokeless tobacco: Never Used  Vaping Use  . Vaping Use: Never used  Substance Use Topics  . Alcohol use: Not Currently    Comment: occ  . Drug use: No    Home Medications Prior to Admission medications   Medication Sig Start Date End Date Taking? Authorizing Provider  albuterol (VENTOLIN HFA) 108 (90 Base) MCG/ACT inhaler INHALE 2 PUFFS INTO THE LUNGS EVERY 4 (FOUR) HOURS AS NEEDED. FOR SHORTNESS OF BREATH 10/06/16   Forrest Moron, MD  amphetamine-dextroamphetamine (ADDERALL) 20 MG tablet Take 20 mg by mouth 2 (two) times daily.    [provider]  benzonatate (TESSALON) 100 MG capsule Take 1 capsule (100 mg total) by mouth 2 (two) times daily as needed for cough. 10/06/16   Forrest Moron, MD  citalopram (CELEXA) 40 MG tablet Take 40 mg by mouth daily.    [provider]  cloNIDine (CATAPRES) 0.2 MG tablet Take 0.2 mg by mouth 2 (two) times daily.    [provider]  doxycycline (VIBRAMYCIN) 100 MG capsule Take 1 capsule (100 mg total) by mouth 2 (two) times daily. 11/12/17   Montine Circle, PA-C  ergocalciferol (VITAMIN D2) 1.25 MG (50000 UT) capsule Take by mouth. 08/04/17   [provider]  ferrous sulfate 325 (65 FE) MG tablet Take 325 mg by mouth 2 (two) times daily with a meal. Reported on 10/28/2015 10/18/13   Delfina Redwood, MD  gabapentin (NEURONTIN) 300 MG capsule Take 300 mg by mouth 3 (three) times daily.    [provider]  HYDROcodone-acetaminophen (NORCO/VICODIN) 5-325 MG tablet Take 1 tablet by mouth every 4 (four) hours as needed. 09/13/18   Isla Pence, MD  ibuprofen (ADVIL,MOTRIN) 600 MG tablet Take 1 tablet (600 mg total) by mouth every 6 (six) hours as needed. 09/13/18   Isla Pence, MD  Melatonin 5 MG CAPS Take by mouth.    [provider]  meloxicam  (MOBIC) 15 MG tablet Take 1 tablet (15 mg total) by mouth daily. 11/08/16   Kirichenko, Tatyana, PA-C  methocarbamol (ROBAXIN) 500 MG tablet Take 1 tablet (500 mg total) by mouth 2 (two) times daily. 11/08/16   Kirichenko, Lahoma Rocker, PA-C  Multiple Vitamin (MULTIVITAMIN) capsule Take 1 capsule by mouth daily.  [provider]  naproxen (NAPROSYN) 500 MG tablet Take 1 tablet (500 mg total) by mouth 2 (two) times daily with a meal. 03/17/16   Leo Grosser, MD  ondansetron (ZOFRAN ODT) 4 MG disintegrating tablet 4mg  ODT q4 hours prn nausea/vomit 09/15/20   Malvin Johns, MD  traMADol (ULTRAM) 50 MG tablet Take 1 tablet (50 mg total) by mouth every 6 (six) hours as needed. 06/12/17   Margarita Mail, PA-C    Allergies    Neomycin  Review of Systems   Review of Systems  Constitutional: Positive for chills, fatigue and fever. Negative for diaphoresis.  HENT: Positive for congestion and rhinorrhea. Negative for sneezing.   Eyes: Negative.   Respiratory: Positive for cough and shortness of breath. Negative for chest tightness.   Cardiovascular: Negative for chest pain and leg swelling.  Gastrointestinal: Positive for diarrhea, nausea and vomiting. Negative for abdominal pain and blood in stool.  Genitourinary: Negative for difficulty urinating, flank pain, frequency and hematuria.  Musculoskeletal: Positive for myalgias. Negative for arthralgias and back pain.  Skin: Negative for rash.  Neurological: Positive for headaches. Negative for dizziness, speech difficulty, weakness and numbness.    Physical Exam Updated Vital Signs BP 126/88 (BP Location: Right Arm)   Pulse 65   Temp 98.2 F (36.8 C) (Oral)   Resp 18   Ht 5\' 4"  (1.626 m)   Wt 107 kg   SpO2 100%   BMI 40.51 kg/m   Physical Exam Constitutional:      Appearance: She is well-developed.  HENT:     Head: Normocephalic and atraumatic.  Eyes:     Pupils: Pupils are equal, round, and reactive to light.  Cardiovascular:      Rate and Rhythm: Normal rate and regular rhythm.     Heart sounds: Normal heart sounds.  Pulmonary:     Effort: Pulmonary effort is normal. No respiratory distress.     Breath sounds: Normal breath sounds. No wheezing or rales.  Chest:     Chest wall: No tenderness.  Abdominal:     General: Bowel sounds are normal.     Palpations: Abdomen is soft.     Tenderness: There is no abdominal tenderness. There is no guarding or rebound.  Musculoskeletal:        General: Normal range of motion.     Cervical back: Normal range of motion and neck supple.  Lymphadenopathy:     Cervical: No cervical adenopathy.  Skin:    General: Skin is warm and dry.     Findings: No rash.  Neurological:     Mental Status: She is alert and oriented to person, place, and time.     ED Results / Procedures / Treatments   Labs (all labs ordered are listed, but only abnormal results are displayed) Labs Reviewed  CBC WITH DIFFERENTIAL/PLATELET - Abnormal; Notable for the following components:      Result Value   RBC 5.44 (*)    MCH 25.6 (*)    All other components within normal limits  URINALYSIS, ROUTINE W REFLEX MICROSCOPIC - Abnormal; Notable for the following components:   Leukocytes,Ua TRACE (*)    All other components within normal limits  URINALYSIS, MICROSCOPIC (REFLEX) - Abnormal; Notable for the following components:   Bacteria, UA FEW (*)    All other components within normal limits  RESP PANEL BY RT-PCR (FLU A&B, COVID) ARPGX2  BASIC METABOLIC PANEL    EKG None  Radiology DG Chest [facility] [address]  Result Date: 09/15/2020 CLINICAL DATA:  Cough. EXAM: PORTABLE CHEST 1 VIEW COMPARISON:  November 12, 2017. FINDINGS: The heart size and mediastinal contours are within normal limits. Both lungs are clear. The visualized skeletal structures are unremarkable. IMPRESSION: No active disease. Electronically Signed   By: Marijo Conception M.D.   On: 09/15/2020 12:00    Procedures Procedures (including  critical care time)  Medications Ordered in ED Medications  ondansetron (ZOFRAN) injection 4 mg (4 mg Intravenous Given 09/15/20 0930)  sodium chloride 0.9 % bolus 500 mL (0 mLs Intravenous Stopped 09/15/20 1050)    ED Course  I have reviewed the triage vital signs and the nursing notes.  Pertinent labs & imaging results that were available during my care of the patient were reviewed by me and considered in my medical decision making (see chart for details).    MDM Rules/Calculators/A&P                          Patient is a 49 year old female who presents with some congestion, mild coughing and vomiting/diarrhea.  She had symptoms that were concerning for Covid with loss of taste and smell although her Covid test is negative.  Chest x-ray is clear without evidence of pneumonia.  No hypoxia on ambulation.  Her labs are nonconcerning.  No evidence of UTI.  Her abdominal exam is benign.  She was given IV fluids and antiemetics.  She is feeling much better and is able to tolerate oral fluids.  She likely has a viral process.  She was discharged home in good condition.  Symptomatic care instructions were given.  She was given prescription for Zofran.  Return precautions were given. Final Clinical Impression(s) / ED Diagnoses Final diagnoses:  Influenza-like illness    Rx / DC Orders ED Discharge Orders         Ordered    ondansetron (ZOFRAN ODT) 4 MG disintegrating tablet        09/15/20 1216           Malvin Johns, MD 09/15/20 1222

## 2020-09-15 NOTE — ED Notes (Signed)
Patient given I cup ice water for po challenge

## 2020-09-15 NOTE — ED Notes (Signed)
Pt ambulated with RA SPO2 100-99%, P 86-88, pt sitting RA SPO2 100% P 79.

## 2021-02-03 ENCOUNTER — Emergency Department (HOSPITAL_COMMUNITY): Payer: No Typology Code available for payment source

## 2021-02-03 ENCOUNTER — Emergency Department (HOSPITAL_COMMUNITY)
Admission: EM | Admit: 2021-02-03 | Discharge: 2021-02-03 | Disposition: A | Discharge status: Home / Self Care | Payer: No Typology Code available for payment source | Attending: Emergency Medicine | Admitting: Emergency Medicine

## 2021-02-03 ENCOUNTER — Encounter (HOSPITAL_COMMUNITY): Payer: Self-pay | Admitting: Emergency Medicine

## 2021-02-03 DIAGNOSIS — M542 Cervicalgia: Secondary | ICD-10-CM | POA: Insufficient documentation

## 2021-02-03 DIAGNOSIS — E119 Type 2 diabetes mellitus without complications: Secondary | ICD-10-CM | POA: Diagnosis not present

## 2021-02-03 DIAGNOSIS — R519 Headache, unspecified: Secondary | ICD-10-CM | POA: Diagnosis not present

## 2021-02-03 DIAGNOSIS — M545 Low back pain, unspecified: Secondary | ICD-10-CM | POA: Insufficient documentation

## 2021-02-03 DIAGNOSIS — Z79899 Other long term (current) drug therapy: Secondary | ICD-10-CM | POA: Insufficient documentation

## 2021-02-03 DIAGNOSIS — I10 Essential (primary) hypertension: Secondary | ICD-10-CM | POA: Insufficient documentation

## 2021-02-03 DIAGNOSIS — Y92410 Unspecified street and highway as the place of occurrence of the external cause: Secondary | ICD-10-CM | POA: Diagnosis not present

## 2021-02-03 DIAGNOSIS — J45909 Unspecified asthma, uncomplicated: Secondary | ICD-10-CM | POA: Insufficient documentation

## 2021-02-03 MED ORDER — METHOCARBAMOL 500 MG PO TABS: 500.0000 mg | ORAL_TABLET | Freq: Two times a day (BID) | ORAL | 0 refills | Status: AC

## 2021-02-03 MED ORDER — LIDOCAINE 5 % EX PTCH: 1.0000 | MEDICATED_PATCH | CUTANEOUS | 0 refills | Status: DC

## 2021-02-03 MED ORDER — METHOCARBAMOL 500 MG PO TABS: 500.0000 mg | ORAL_TABLET | Freq: Two times a day (BID) | ORAL | 0 refills | Status: DC

## 2021-02-03 MED ORDER — METHOCARBAMOL 500 MG PO TABS
500.0000 mg | ORAL_TABLET | Freq: Once | ORAL | Status: AC
  Administered 2021-02-03: 500 mg via ORAL
  Filled 2021-02-03: qty 1

## 2021-02-03 MED ORDER — LIDOCAINE 5 % EX PTCH: 1.0000 | MEDICATED_PATCH | CUTANEOUS | 0 refills | Status: AC

## 2021-02-03 NOTE — ED Notes (Signed)
Patient verbalizes understanding of discharge instructions. Opportunity for questioning and answers were provided. Armband removed by staff, pt discharged from ED via wheelchair to lobby to return home with significant other.

## 2021-02-03 NOTE — Discharge Instructions (Addendum)
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
## 2021-05-06 ENCOUNTER — Emergency Department (HOSPITAL_COMMUNITY)
Admission: EM | Admit: 2021-05-06 | Discharge: 2021-05-07 | Disposition: A | Discharge status: Home / Self Care | Payer: 59 | Attending: Emergency Medicine | Admitting: Emergency Medicine

## 2021-05-06 ENCOUNTER — Other Ambulatory Visit: Payer: Self-pay

## 2021-05-06 DIAGNOSIS — J45909 Unspecified asthma, uncomplicated: Secondary | ICD-10-CM | POA: Diagnosis not present

## 2021-05-06 DIAGNOSIS — E119 Type 2 diabetes mellitus without complications: Secondary | ICD-10-CM | POA: Insufficient documentation

## 2021-05-06 DIAGNOSIS — R61 Generalized hyperhidrosis: Secondary | ICD-10-CM | POA: Insufficient documentation

## 2021-05-06 DIAGNOSIS — Z79899 Other long term (current) drug therapy: Secondary | ICD-10-CM | POA: Diagnosis not present

## 2021-05-06 DIAGNOSIS — Z733 Stress, not elsewhere classified: Secondary | ICD-10-CM | POA: Diagnosis not present

## 2021-05-06 DIAGNOSIS — I10 Essential (primary) hypertension: Secondary | ICD-10-CM | POA: Insufficient documentation

## 2021-05-06 DIAGNOSIS — R410 Disorientation, unspecified: Secondary | ICD-10-CM | POA: Diagnosis not present

## 2021-05-06 DIAGNOSIS — F430 Acute stress reaction: Secondary | ICD-10-CM

## 2021-05-06 LAB — COMPREHENSIVE METABOLIC PANEL
ALT: 17 U/L (ref 0–44)
AST: 24 U/L (ref 15–41)
Albumin: 3.6 g/dL (ref 3.5–5.0)
Alkaline Phosphatase: 76 U/L (ref 38–126)
Anion gap: 7 (ref 5–15)
BUN: 14 mg/dL (ref 6–20)
CO2: 26 mmol/L (ref 22–32)
Calcium: 8.9 mg/dL (ref 8.9–10.3)
Chloride: 107 mmol/L (ref 98–111)
Creatinine, Ser: 0.81 mg/dL (ref 0.44–1.00)
GFR, Estimated: >60 mL/min (ref >60)
Glucose, Bld: 139 mg/dL — ABNORMAL HIGH (ref 70–99)
Potassium: 3.7 mmol/L (ref 3.5–5.1)
Sodium: 140 mmol/L (ref 135–145)
Total Bilirubin: 0.6 mg/dL (ref 0.3–1.2)
Total Protein: 6.8 g/dL (ref 6.5–8.1)

## 2021-05-06 LAB — CBC WITH DIFFERENTIAL/PLATELET
Abs Immature Granulocytes: 0.02 10*3/uL (ref 0.00–0.07)
Basophils Absolute: 0.1 10*3/uL (ref 0.0–0.1)
Basophils Relative: 1 %
Eosinophils Absolute: 0.6 10*3/uL — ABNORMAL HIGH (ref 0.0–0.5)
Eosinophils Relative: 9 %
HCT: 41.6 % (ref 36.0–46.0)
Hemoglobin: 13.2 g/dL (ref 12.0–15.0)
Immature Granulocytes: 0 %
Lymphocytes Relative: 28 %
Lymphs Abs: 1.7 10*3/uL (ref 0.7–4.0)
MCH: 26.3 pg (ref 26.0–34.0)
MCHC: 31.7 g/dL (ref 30.0–36.0)
MCV: 83 fL (ref 80.0–100.0)
Monocytes Absolute: 0.4 10*3/uL (ref 0.1–1.0)
Monocytes Relative: 7 %
Neutro Abs: 3.2 10*3/uL (ref 1.7–7.7)
Neutrophils Relative %: 55 %
Platelets: 256 10*3/uL (ref 150–400)
RBC: 5.01 MIL/uL (ref 3.87–5.11)
RDW: 13.4 % (ref 11.5–15.5)
WBC: 6 10*3/uL (ref 4.0–10.5)
nRBC: 0 % (ref 0.0–0.2)

## 2021-05-06 NOTE — ED Provider Notes (Signed)
[facility] EMERGENCY DEPARTMENT Provider Note   CSN: [id number] Arrival date & time: 05/06/21  2155     History Chief Complaint  Patient presents with   Stress    BIB EMS, 20G IV in Left AC. 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 Was at work when pt reports sweating, nausea, disoriented, unable to get words out. Upon EMS arrival pt started displayed left side contractions for about 15 seconds. Pt also reports son was stressed PTA at work this evening    Holly Hopkins is a 50 y.o. female.  Patient to ED with symptoms that started around 8:00 pm, first with sweating, then nausea that got worse over a short period without vomiting. She works on a [employer] and was about one hour into her shift. She then felt disoriented, like she couldn't remember what she was doing. A co-worker stopped to ask her if she was ok and she felt she knew what she wanted to say but could not form the words. She sat down into a chair that was provided for her and EMS was called. She states she remembers all events and does not feel she passed out at any point. She states that EMS reported to her that her left arm and leg became rigid without tremor. She feels back to her baseline now. She reports remote history of "stress induced seizures" but none in about 10 years and she remembers symptoms as dissimilar to tonight's events. No new medications recently. No recent illness, fever. No SOB, chest or abdominal pain, vomiting or diarrhea.   The history is provided by the patient. No language interpreter was used.      Past Medical History:  Diagnosis Date   ADD (attention deficit disorder) 06/11/2015   Allergy    Anxiety    Asthma    allergy related..   Depression    H/O tubal ligation 05/15/2003   History of laparoscopic cholecystectomy 1991   Hypertension    pregnancy related    Migraine    migraine is aura presenting before seizure.   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   PIH (pregnancy induced hypertension) 01/25/2012   Fibroids 01/25/2012   Breast mass, left 01/25/2012   STD (sexually transmitted disease) 01/25/2012   Insulin resistance 01/25/2012   Migraine    ASTHMA 01/17/2007   BIPOLAR DISORDER 12/07/2006   Allergic rhinitis, cause unspecified 12/07/2006   GASTROESOPHAGEAL REFLUX, NO ESOPHAGITIS 12/07/2006    Past Surgical History:  Procedure Laterality Date   CESAREAN SECTION     x2   DILITATION & CURRETTAGE/HYSTROSCOPY WITH NOVASURE ABLATION N/A 11/29/2013   Procedure: DILATATION & CURETTAGE/HYSTEROSCOPY WITH  RESECTION;  Surgeon: Ena Dawley, MD;  Location: [facility];  Service: Gynecology;  Laterality: N/A;   INDUCED ABORTION     LAPAROSCOPIC CHOLECYSTECTOMY     TUBAL LIGATION       OB History     [place]  3   Para  2   Term  2   Preterm  0   AB  1   Living  2      SAB  0   IAB  0   Ectopic  0   Multiple  0   Live Births              Family History  Problem Relation Age of Onset   Diabetes Mother    Heart disease Mother    Diabetes Father    Cancer Father        THYROID   Hypertension Father    Hyperlipidemia Father    Mental illness Brother        PSTD,DEPRESSION   Migraines Son    Allergy (severe) Son    Diabetes Maternal Aunt    Goiter Maternal Uncle    Diabetes Paternal Aunt    Alcohol abuse Paternal Uncle        ALCOHOL AND DRUGS   Heart  disease Maternal Grandmother    Diabetes Maternal Grandmother    Hypertension Maternal Grandmother    Diabetes Maternal Grandfather    Heart disease Maternal Grandfather    Cancer Maternal Grandfather        QUESTION WHAT KIND   Heart disease Paternal Grandmother    Diabetes Paternal Grandmother    Hypertension Paternal Grandmother    Cancer Paternal Grandmother        BREAST   Migraines Son    Allergy (severe) Son    Heart disease Maternal Aunt    Diabetes Paternal Aunt    Arthritis Paternal Aunt     Social History   Tobacco Use   Smoking status: Never   Smokeless tobacco: Never  Vaping Use   Vaping Use: Never used  Substance Use Topics   Alcohol use: Not Currently    Comment: occ   Drug use: No    Home Medications Prior to Admission medications   Medication Sig Start Date End Date Taking? Authorizing Provider  ADDERALL XR 30 MG 24 hr capsule Take 30 mg by mouth every morning. 04/01/21   [provider]  albuterol (VENTOLIN HFA) 108 (90 Base) MCG/ACT inhaler INHALE 2 PUFFS INTO THE LUNGS EVERY 4 (FOUR) HOURS AS NEEDED. FOR SHORTNESS OF BREATH 10/06/16   Forrest Moron, MD  amphetamine-dextroamphetamine (ADDERALL) 20 MG tablet Take 20 mg by mouth 2 (two) times daily.    [provider]  amphetamine-dextroamphetamine (ADDERALL) 30 MG tablet Take 1 tablet by mouth daily. 03/31/21   [provider]  benzonatate (TESSALON) 100 MG capsule Take 1 capsule (100 mg total) by mouth 2 (two) times daily as needed for cough. 10/06/16   Forrest Moron, MD  citalopram (CELEXA) 40 MG tablet Take 40 mg by mouth daily.    [provider]  cloNIDine (CATAPRES) 0.2 MG tablet Take 0.2 mg by mouth 2 (two) times daily.    [provider]  doxycycline (VIBRAMYCIN) 100 MG capsule Take 1 capsule (100 mg total) by mouth 2 (two) times daily. 11/12/17   Montine Circle, PA-C  ergocalciferol (VITAMIN D2) 1.25 MG (50000 UT) capsule Take by mouth. 08/04/17    [provider]  ferrous sulfate 325 (65 FE) MG tablet Take 325 mg by mouth 2 (two) times daily with a meal. Reported on 10/28/2015 10/18/13   Delfina Redwood, MD  gabapentin (NEURONTIN) 300 MG capsule Take 300 mg by mouth 3 (three) times daily.    [provider]  gabapentin (NEURONTIN) 800 MG tablet Take by mouth. 03/31/21   [provider]  HYDROcodone-acetaminophen (NORCO/VICODIN) 5-325 MG tablet Take 1 tablet by mouth every 4 (four) hours as needed. 09/13/18   Isla Pence, MD  ibuprofen (ADVIL,MOTRIN) 600 MG tablet Take 1 tablet (600 mg total) by mouth every 6 (six) hours as needed. 09/13/18   Isla Pence, MD  lidocaine (LIDODERM) 5 % Place 1 patch onto the skin daily. Remove & Discard patch within 12 hours or as directed by MD 02/03/21   Henderly, Britni A, PA-C  Melatonin 5 MG CAPS Take by mouth.    [provider]  meloxicam (MOBIC) 15 MG tablet Take 1 tablet (15 mg total) by mouth daily. 11/08/16   Kirichenko, Tatyana, PA-C  methocarbamol (ROBAXIN) 500 MG tablet Take 1 tablet (500 mg total) by mouth 2 (two) times daily. 02/03/21   Henderly, Britni A, PA-C  Multiple Vitamin (MULTIVITAMIN) capsule Take 1 capsule by mouth daily.    [provider]  naproxen (NAPROSYN) 500 MG tablet Take 1 tablet (500 mg total) by mouth 2 (two) times daily with a meal. 03/17/16   Leo Grosser, MD  ondansetron (ZOFRAN ODT) 4 MG disintegrating tablet '4mg'$  ODT q4 hours prn nausea/vomit 09/15/20   Malvin Johns, MD  RYBELSUS 7 MG TABS Take 1 tablet by mouth daily. 11/11/20   [provider]  traMADol (ULTRAM) 50 MG tablet Take 1 tablet (50 mg total) by mouth every 6 (six) hours as needed. 06/12/17   Margarita Mail, PA-C    Allergies    Neomycin  Review of Systems   Review of Systems  Constitutional:  Positive for diaphoresis. Negative for chills and fever.  HENT: Negative.    Eyes:  Negative for visual disturbance.  Respiratory: Negative.     Cardiovascular: Negative.   Gastrointestinal:  Positive for nausea. Negative for vomiting.  Genitourinary: Negative.   Musculoskeletal: Negative.   Skin: Negative.   Neurological:  Positive for speech difficulty, weakness and light-headedness.       See HPI.   Physical Exam Updated Vital Signs BP 136/80 (BP Location: Right Arm)   Pulse 67   Temp 98.8 F (37.1 C) (Oral)   Resp 18   Ht '5\' 4"'$  (1.626 m)   Wt 90.7 kg   LMP 12/09/2019 (Approximate)   SpO2 100%   BMI 34.33 kg/m   Physical Exam Vitals and nursing note reviewed.  Constitutional:      Appearance: She is well-developed.  HENT:     Head: Normocephalic.  Cardiovascular:     Rate and Rhythm: Normal rate and regular rhythm.     Heart sounds: No murmur heard. Pulmonary:     Effort: Pulmonary effort is normal.     Breath sounds: Normal breath sounds. No wheezing, rhonchi or rales.  Abdominal:     General: Bowel sounds are normal.     Palpations: Abdomen is soft.     Tenderness: There is no abdominal tenderness. There is no guarding or rebound.  Musculoskeletal:        General: Normal range of motion.     Cervical back: Normal range of motion and neck supple.  Skin:    General: Skin is warm and dry.  Neurological:     General: No focal deficit present.     Mental Status: She is alert and oriented to person, place, and time.     GCS: GCS eye subscore is 4. GCS verbal subscore is 5. GCS motor  subscore is 6.     Cranial Nerves: No cranial nerve deficit or dysarthria.     Motor: No weakness, tremor, abnormal muscle tone or pronator drift.     Comments: Reports sensory discrepancy to light touch on left face, arm and leg, "feels lighter".     ED Results / Procedures / Treatments   Labs (all labs ordered are listed, but only abnormal results are displayed) Labs Reviewed - No data to display  EKG None  Radiology No results found.  Procedures Procedures   Medications Ordered in ED Medications - No data to  display  ED Course  I have reviewed the triage vital signs and the nursing notes.  Pertinent labs & imaging results that were available during my care of the patient were reviewed by me and considered in my medical decision making (see chart for details).    MDM Rules/Calculators/A&P                           Patient to ED with ss/sxs as per HPI.   She is well appearing, asymptomatic now, in NAD. VSS and normal. Labs, CT pending.   Discussed with neurology, Dr. Rory Percy. If CT and work up is negative, do not feel MR indicated and symptoms likely related to stress induced situation as in the past.   Labs reviewed and are essentially unremarkable. Ct without acute changes. On recheck, the patient continues to be at baseline without development of new symptoms.   She can be discharged home. Return precautions discussed.   Final Clinical Impression(s) / ED Diagnoses Final diagnoses:  None   Stress  Rx / DC Orders ED Discharge Orders     None        Dennie Bible 05/07/21 0238    Palumbo, April, MD 05/07/21 0246

## 2021-05-06 NOTE — ED Triage Notes (Signed)
BIB EMS, 20G IV in Left AC. 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 Was at work when pt reports sweating, nausea, disoriented, unable to get words out. Upon EMS arrival pt started displayed left side contractions for about 15 seconds. Was placed on 2L O2. Pt now at baseline. Pt also reports son was stressed PTA at work this evening

## 2021-05-07 ENCOUNTER — Emergency Department (HOSPITAL_COMMUNITY): Payer: 59

## 2021-05-07 LAB — URINALYSIS, ROUTINE W REFLEX MICROSCOPIC
Bilirubin Urine: NEGATIVE
Glucose, UA: NEGATIVE mg/dL
Hgb urine dipstick: NEGATIVE
Ketones, ur: 5 mg/dL — AB
Leukocytes,Ua: NEGATIVE
Nitrite: NEGATIVE
Protein, ur: NEGATIVE mg/dL
Specific Gravity, Urine: 1.025 (ref 1.005–1.030)
pH: 5 (ref 5.0–8.0)

## 2021-05-07 NOTE — Discharge Instructions (Signed)
Your labs and head CT were normal, and did not identify any serious or life threatening condition. You can be discharged home.  Get plenty of rest. Follow up with your doctor for recheck in 2-3 days if needed for any recurrent symptoms.

## 2021-05-07 NOTE — ED Notes (Signed)
Patient transported to CT 

## 2022-01-07 ENCOUNTER — Emergency Department (HOSPITAL_BASED_OUTPATIENT_CLINIC_OR_DEPARTMENT_OTHER): Payer: Self-pay

## 2022-01-07 ENCOUNTER — Emergency Department (HOSPITAL_BASED_OUTPATIENT_CLINIC_OR_DEPARTMENT_OTHER)
Admission: EM | Admit: 2022-01-07 | Discharge: 2022-01-07 | Disposition: A | Discharge status: Home / Self Care | Payer: Self-pay | Attending: Student | Admitting: Student

## 2022-01-07 ENCOUNTER — Encounter (HOSPITAL_BASED_OUTPATIENT_CLINIC_OR_DEPARTMENT_OTHER): Payer: Self-pay | Admitting: Emergency Medicine

## 2022-01-07 ENCOUNTER — Other Ambulatory Visit: Payer: Self-pay

## 2022-01-07 DIAGNOSIS — J4541 Moderate persistent asthma with (acute) exacerbation: Secondary | ICD-10-CM | POA: Insufficient documentation

## 2022-01-07 DIAGNOSIS — Z20822 Contact with and (suspected) exposure to covid-19: Secondary | ICD-10-CM | POA: Insufficient documentation

## 2022-01-07 DIAGNOSIS — Z7951 Long term (current) use of inhaled steroids: Secondary | ICD-10-CM | POA: Insufficient documentation

## 2022-01-07 DIAGNOSIS — E119 Type 2 diabetes mellitus without complications: Secondary | ICD-10-CM | POA: Insufficient documentation

## 2022-01-07 DIAGNOSIS — J45901 Unspecified asthma with (acute) exacerbation: Secondary | ICD-10-CM

## 2022-01-07 LAB — CBC WITH DIFFERENTIAL/PLATELET
Abs Immature Granulocytes: 0.06 10*3/uL (ref 0.00–0.07)
Basophils Absolute: 0.1 10*3/uL (ref 0.0–0.1)
Basophils Relative: 1 %
Eosinophils Absolute: 0.7 10*3/uL — ABNORMAL HIGH (ref 0.0–0.5)
Eosinophils Relative: 11 %
HCT: 42.8 % (ref 36.0–46.0)
Hemoglobin: 13.5 g/dL (ref 12.0–15.0)
Immature Granulocytes: 1 %
Lymphocytes Relative: 30 %
Lymphs Abs: 1.8 10*3/uL (ref 0.7–4.0)
MCH: 26.2 pg (ref 26.0–34.0)
MCHC: 31.5 g/dL (ref 30.0–36.0)
MCV: 82.9 fL (ref 80.0–100.0)
Monocytes Absolute: 0.5 10*3/uL (ref 0.1–1.0)
Monocytes Relative: 9 %
Neutro Abs: 2.9 10*3/uL (ref 1.7–7.7)
Neutrophils Relative %: 48 %
Platelets: 242 10*3/uL (ref 150–400)
RBC: 5.16 MIL/uL — ABNORMAL HIGH (ref 3.87–5.11)
RDW: 14 % (ref 11.5–15.5)
WBC: 6 10*3/uL (ref 4.0–10.5)
nRBC: 0 % (ref 0.0–0.2)

## 2022-01-07 LAB — BASIC METABOLIC PANEL
Anion gap: 7 (ref 5–15)
BUN: 15 mg/dL (ref 6–20)
CO2: 27 mmol/L (ref 22–32)
Calcium: 8.8 mg/dL — ABNORMAL LOW (ref 8.9–10.3)
Chloride: 107 mmol/L (ref 98–111)
Creatinine, Ser: 0.8 mg/dL (ref 0.44–1.00)
GFR, Estimated: >60 mL/min (ref >60)
Glucose, Bld: 190 mg/dL — ABNORMAL HIGH (ref 70–99)
Potassium: 4 mmol/L (ref 3.5–5.1)
Sodium: 141 mmol/L (ref 135–145)

## 2022-01-07 LAB — RESP PANEL BY RT-PCR (FLU A&B, COVID) ARPGX2
Influenza A by PCR: NEGATIVE
Influenza B by PCR: NEGATIVE
SARS Coronavirus 2 by RT PCR: NEGATIVE

## 2022-01-07 MED ORDER — MAGNESIUM SULFATE 2 GM/50ML IV SOLN
2.0000 g | Freq: Once | INTRAVENOUS | Status: AC
  Administered 2022-01-07: 2 g via INTRAVENOUS
  Filled 2022-01-07: qty 50

## 2022-01-07 MED ORDER — METHYLPREDNISOLONE SODIUM SUCC 125 MG IJ SOLR
125.0000 mg | Freq: Once | INTRAMUSCULAR | Status: AC
  Administered 2022-01-07: 125 mg via INTRAVENOUS
  Filled 2022-01-07: qty 2

## 2022-01-07 MED ORDER — ALBUTEROL SULFATE HFA 108 (90 BASE) MCG/ACT IN AERS: INHALATION_SPRAY | RESPIRATORY_TRACT | 3 refills | Status: AC

## 2022-01-07 MED ORDER — ALBUTEROL SULFATE (2.5 MG/3ML) 0.083% IN NEBU: 2.5000 mg | INHALATION_SOLUTION | Freq: Four times a day (QID) | RESPIRATORY_TRACT | 12 refills | Status: AC | PRN

## 2022-01-07 MED ORDER — ALBUTEROL (5 MG/ML) CONTINUOUS INHALATION SOLN: 5.0000 mg/h | INHALATION_SOLUTION | Freq: Once | RESPIRATORY_TRACT | Status: DC

## 2022-01-07 MED ORDER — ALBUTEROL SULFATE (2.5 MG/3ML) 0.083% IN NEBU
INHALATION_SOLUTION | RESPIRATORY_TRACT | Status: AC
  Administered 2022-01-07: 5 mg
  Filled 2022-01-07: qty 3

## 2022-01-07 MED ORDER — ALBUTEROL (5 MG/ML) CONTINUOUS INHALATION SOLN
INHALATION_SOLUTION | RESPIRATORY_TRACT | Status: AC
  Filled 2022-01-07: qty 0.5

## 2022-01-07 NOTE — ED Provider Notes (Signed)
?[facility] EMERGENCY DEPARTMENT ?Provider Note ? ? ?CSN: [id number] ?Arrival date & time: 01/07/22  1515 ? ?  ? ?History ? ?Chief Complaint  ?Patient presents with  ? Shortness of Breath  ? ? ?Holly Hopkins is a 51 y.o. female with a past medical history of asthma presenting today due to shortness of breath.  She says that she has been feeling this way for 2 weeks however it acutely worsened today.  She has been using her albuterol inhaler however has been out of her nebulizer solution.  Denies chest pain but does say her chest feels tight.  No recent travel, surgery, hemoptysis or history of DVT.  Reports a history of diabetes but that her blood sugars have been well controlled at home.  Denies any fever, chills, congestion or recent illness.  Says her asthma exacerbations are usually triggered by changes in the weather and allergens such as pollen. ? ? ? ?Home Medications ?Prior to Admission medications   ?Medication Sig Start Date End Date Taking? Authorizing Provider  ?ADDERALL XR 30 MG 24 hr capsule Take 30 mg by mouth every morning. 04/01/21   [provider]  ?albuterol (VENTOLIN HFA) 108 (90 Base) MCG/ACT inhaler INHALE 2 PUFFS INTO THE LUNGS EVERY 4 (FOUR) HOURS AS NEEDED. FOR SHORTNESS OF BREATH 10/06/16   Forrest Moron, MD  ?amphetamine-dextroamphetamine (ADDERALL) 20 MG tablet Take 20 mg by mouth 2 (two) times daily.    [provider]  ?amphetamine-dextroamphetamine (ADDERALL) 30 MG tablet Take 1 tablet by mouth daily. 03/31/21   [provider]  ?benzonatate (TESSALON) 100 MG capsule Take 1 capsule (100 mg total) by mouth 2 (two) times daily as needed for cough. 10/06/16   Forrest Moron, MD  ?citalopram (CELEXA) 40 MG tablet Take 40 mg by mouth daily.    [provider]  ?cloNIDine (CATAPRES) 0.2 MG tablet Take 0.2 mg by mouth 2 (two) times daily.    [provider]  ?doxycycline (VIBRAMYCIN) 100 MG capsule Take 1 capsule (100 mg total) by  mouth 2 (two) times daily. 11/12/17   Montine Circle, PA-C  ?ergocalciferol (VITAMIN D2) 1.25 MG (50000 UT) capsule Take by mouth. 08/04/17   [provider]  ?ferrous sulfate 325 (65 FE) MG tablet Take 325 mg by mouth 2 (two) times daily with a meal. Reported on 10/28/2015 10/18/13   Delfina Redwood, MD  ?gabapentin (NEURONTIN) 300 MG capsule Take 300 mg by mouth 3 (three) times daily.    [provider]  ?gabapentin (NEURONTIN) 800 MG tablet Take by mouth. 03/31/21   [provider]  ?HYDROcodone-acetaminophen (NORCO/VICODIN) 5-325 MG tablet Take 1 tablet by mouth every 4 (four) hours as needed. 09/13/18   Isla Pence, MD  ?ibuprofen (ADVIL,MOTRIN) 600 MG tablet Take 1 tablet (600 mg total) by mouth every 6 (six) hours as needed. 09/13/18   Isla Pence, MD  ?lidocaine (LIDODERM) 5 % Place 1 patch onto the skin daily. Remove & Discard patch within 12 hours or as directed by MD 02/03/21   Henderly, Britni A, PA-C  ?Melatonin 5 MG CAPS Take by mouth.    [provider]  ?meloxicam (MOBIC) 15 MG tablet Take 1 tablet (15 mg total) by mouth daily. 11/08/16   Kirichenko, Lahoma Rocker, PA-C  ?methocarbamol (ROBAXIN) 500 MG tablet Take 1 tablet (500 mg total) by mouth 2 (two) times daily. 02/03/21   Henderly, Britni A, PA-C  ?Multiple Vitamin (MULTIVITAMIN) capsule Take 1 capsule by mouth daily.  [provider]  ?naproxen (NAPROSYN) 500 MG tablet Take 1 tablet (500 mg total) by mouth 2 (two) times daily with a meal. 03/17/16   Leo Grosser, MD  ?ondansetron (ZOFRAN ODT) 4 MG disintegrating tablet '4mg'$  ODT q4 hours prn nausea/vomit 09/15/20   Malvin Johns, MD  ?RYBELSUS 7 MG TABS Take 1 tablet by mouth daily. 11/11/20   [provider]  ?traMADol (ULTRAM) 50 MG tablet Take 1 tablet (50 mg total) by mouth every 6 (six) hours as needed. 06/12/17   Margarita Mail, PA-C  ?   ? ?Allergies    ?Neomycin   ? ?Review of Systems   ?Review of Systems  ?Constitutional:  Positive for  chills. Negative for fever.  ?HENT:  Negative for congestion.   ?Respiratory:  Positive for cough, chest tightness and shortness of breath.   ?Cardiovascular:  Negative for chest pain and leg swelling.  ?Gastrointestinal:  Negative for nausea.  ?Musculoskeletal:  Negative for back pain.  ? ?Physical Exam ?Updated Vital Signs ?BP 122/74 (BP Location: Right Arm)   Pulse 82   Temp 98.4 ?F (36.9 ?C) (Oral)   Resp (!) 22   Ht '5\' 4"'$  (1.626 m)   Wt 100.2 kg   SpO2 97%   BMI 37.93 kg/m?  ?Physical Exam ?Vitals and nursing note reviewed.  ?Constitutional:   ?   Appearance: Normal appearance. She is not ill-appearing.  ?HENT:  ?   Head: Normocephalic and atraumatic.  ?Eyes:  ?   General: No scleral icterus. ?   Conjunctiva/sclera: Conjunctivae normal.  ?Cardiovascular:  ?   Rate and Rhythm: Normal rate and regular rhythm.  ?Pulmonary:  ?   Effort: Pulmonary effort is normal. No respiratory distress.  ?   Breath sounds: Examination of the right-lower field reveals decreased breath sounds. Decreased breath sounds present. No wheezing, rhonchi or rales.  ?Chest:  ?   Chest wall: No tenderness.  ?Musculoskeletal:  ?   Right lower leg: No edema.  ?   Left lower leg: No edema.  ?Skin: ?   General: Skin is warm.  ?   Findings: No rash.  ?Neurological:  ?   Mental Status: She is alert.  ?Psychiatric:     ?   Mood and Affect: Mood normal.  ? ? ?ED Results / Procedures / Treatments   ?Labs ?(all labs ordered are listed, but only abnormal results are displayed) ?Labs Reviewed  ?CBC WITH DIFFERENTIAL/PLATELET - Abnormal; Notable for the following components:  ?    Result Value  ? RBC 5.16 (*)   ? Eosinophils Absolute 0.7 (*)   ? All other components within normal limits  ?BASIC METABOLIC PANEL - Abnormal; Notable for the following components:  ? Glucose, Bld 190 (*)   ? Calcium 8.8 (*)   ? All other components within normal limits  ?RESP PANEL BY RT-PCR (FLU A&B, COVID) ARPGX2  ? ? ?EKG ?None ?I viewed patient's EKG and it was in  normal sinus.  No STEMI ? ?Radiology ?DG Chest Portable 1 View ? ?Result Date: 01/07/2022 ?CLINICAL DATA:  Shortness of breath EXAM: PORTABLE CHEST 1 VIEW COMPARISON:  09/15/2020 FINDINGS: The heart size and mediastinal contours are within normal limits. Both lungs are clear. The visualized skeletal structures are unremarkable. IMPRESSION: No active disease. Electronically Signed   By: Elmer Picker M.D.   On: 01/07/2022 15:51   ? ?Procedures ?Procedures  ?Normal sinus with a normal rate ?Medications Ordered in ED ?Medications  ?magnesium sulfate IVPB 2 g  50 mL (has no administration in time range)  ?methylPREDNISolone sodium succinate (SOLU-MEDROL) 125 mg/2 mL injection 125 mg (has no administration in time range)  ?albuterol (PROVENTIL,VENTOLIN) solution continuous neb (has no administration in time range)  ? ? ?ED Course/ Medical Decision Making/ A&P ?  ?                        ?Medical Decision Making ?Amount and/or Complexity of Data Reviewed ?Labs: ordered. ?Radiology: ordered. ? ?Risk ?Prescription drug management. ? ? ?Patient presents to the ED for concern of shortness of breath and suspected asthma exacerbation. Other emergent differential diagnosis for shortness of breath includes, but is not limited to, Pulmonary edema, bronchoconstriction, Pneumonia, Pulmonary embolism, Pneumotherax/ Hemothorax, Dysrythmia, ACS.  ? ?Co morbidities that complicate the patient evaluation include: asthma and DM ? ?Per internal/external chart review: Patient has not had a reported asthma exacerbation since 2017 ? ? ?I performed a full physical exam, pertinent findings include: ? ? ?Diagnostics: ? ?I ordered and viewed labs. The pertinent results include:  ?N/A ? ?I ordered and individually viewed CXR and did not note any acute findings.  Radiologist read the scan is negative. ? ? ?Cardiac Monitoring: ? ?The patient was maintained on a cardiac monitor.  I personally viewed and interpreted the cardiac monitored which  showed NSR ? ?Test Considered: D-dimer however patient is low likely PE as per Wells score ? ?Critical Interventions: Rapid initiation of albuterol. ? ?I ordered magnesium, Solu-Medrol and albuterol DuoNeb for pat

## 2022-01-07 NOTE — Discharge Instructions (Signed)
I have sent your inhaler and a refill of your solution to your pharmacy.  Know that your blood sugars may be elevated in the next few days due to the steroids you received today. ? ?Follow-up with your primary care provider if you continue to have problems with asthma control.  It is also a good idea to start a medication such as Zyrtec, Allegra, Claritin or Xyzal for your seasonal allergies.  This will help avoid asthma exacerbations.  These are once daily medications. ? ?Return with any worsening symptoms. ?

## 2022-01-07 NOTE — ED Triage Notes (Signed)
SOB with cough x 2 weeks. Productive, frothy white mucus ?

## 2022-01-07 NOTE — ED Notes (Signed)
RT assessed patient upon arrival to room. Stated she has asthma and has been more SOB for 2 weeks despite albuterol and and allergy treatments. BBS clear upper and diminished in bases. Stated last treatment was at 1100 ?

## 2022-06-05 ENCOUNTER — Other Ambulatory Visit: Payer: Self-pay

## 2022-06-05 ENCOUNTER — Encounter (HOSPITAL_BASED_OUTPATIENT_CLINIC_OR_DEPARTMENT_OTHER): Payer: Self-pay | Admitting: Emergency Medicine

## 2022-06-05 ENCOUNTER — Emergency Department (HOSPITAL_BASED_OUTPATIENT_CLINIC_OR_DEPARTMENT_OTHER)
Admission: EM | Admit: 2022-06-05 | Discharge: 2022-06-05 | Disposition: A | Discharge status: Home / Self Care | Payer: Self-pay | Attending: Emergency Medicine | Admitting: Emergency Medicine

## 2022-06-05 DIAGNOSIS — J069 Acute upper respiratory infection, unspecified: Secondary | ICD-10-CM | POA: Insufficient documentation

## 2022-06-05 DIAGNOSIS — U071 COVID-19: Secondary | ICD-10-CM | POA: Insufficient documentation

## 2022-06-05 LAB — RESP PANEL BY RT-PCR (FLU A&B, COVID) ARPGX2
Influenza A by PCR: NEGATIVE
Influenza B by PCR: NEGATIVE
SARS Coronavirus 2 by RT PCR: POSITIVE — AB

## 2022-06-05 MED ORDER — ONDANSETRON 4 MG PO TBDP: ORAL_TABLET | ORAL | 0 refills | Status: AC

## 2022-06-05 MED ORDER — AMOXICILLIN-POT CLAVULANATE 875-125 MG PO TABS: 1.0000 | ORAL_TABLET | Freq: Two times a day (BID) | ORAL | 0 refills | Status: AC

## 2022-06-05 MED ORDER — BENZONATATE 100 MG PO CAPS: 100.0000 mg | ORAL_CAPSULE | Freq: Three times a day (TID) | ORAL | 0 refills | Status: AC

## 2022-06-05 NOTE — Discharge Instructions (Addendum)
Take tylenol 2 pills 4 times a day and motrin 4 pills 3 times a day.  Drink plenty of fluids.  Return for worsening shortness of breath, headache, confusion. Follow up with your family doctor.   

## 2022-06-05 NOTE — ED Triage Notes (Signed)
On Thursday patient states that she had a headache coughing  dizziness .States that her chest is burning and does not have an appetite. Some nausea States that she was coughing up some phlegm

## 2022-06-05 NOTE — ED Provider Notes (Signed)
[facility] Provider Note   CSN: [id number] Arrival date & time: 06/05/22  4944     History  Chief Complaint  Patient presents with   Weakness   Headache    Holly Hopkins is a 51 y.o. female.  51 yo F with a chief complaints of cough congestion fevers chills myalgias this is been going on for about 4 days now.  She had a sick coworker and she is not sure what or if they were ever diagnosed with anything.  She has had some burning in her chest when she coughs.  Has had some sinus fullness.   Weakness Associated symptoms: headaches   Headache Associated symptoms: weakness        Home Medications Prior to Admission medications   Medication Sig Start Date End Date Taking? Authorizing Provider  amoxicillin-clavulanate (AUGMENTIN) 875-125 MG tablet Take 1 tablet by mouth every 12 (twelve) hours. 06/05/22  Yes Deno Etienne, DO  benzonatate (TESSALON) 100 MG capsule Take 1 capsule (100 mg total) by mouth every 8 (eight) hours. 06/05/22  Yes Deno Etienne, DO  ondansetron (ZOFRAN-ODT) 4 MG disintegrating tablet '4mg'$  ODT q4 hours prn nausea/vomit 06/05/22  Yes Deno Etienne, DO  ADDERALL XR 30 MG 24 hr capsule Take 30 mg by mouth every morning. 04/01/21   [provider]  albuterol (PROVENTIL) (2.5 MG/3ML) 0.083% nebulizer solution Take 3 mLs (2.5 mg total) by nebulization every 6 (six) hours as needed for wheezing or shortness of breath. 01/07/22   Redwine, Madison A, PA-C  albuterol (VENTOLIN HFA) 108 (90 Base) MCG/ACT inhaler INHALE 2 PUFFS INTO THE LUNGS EVERY 4 (FOUR) HOURS AS NEEDED. FOR SHORTNESS OF BREATH 01/07/22   Redwine, Madison A, PA-C  amphetamine-dextroamphetamine (ADDERALL) 20 MG tablet Take 20 mg by mouth 2 (two) times daily.    [provider]  amphetamine-dextroamphetamine (ADDERALL) 30 MG tablet Take 1 tablet by mouth daily. 03/31/21   [provider]  citalopram (CELEXA) 40 MG tablet Take 40 mg by mouth daily.     [provider]  cloNIDine (CATAPRES) 0.2 MG tablet Take 0.2 mg by mouth 2 (two) times daily.    [provider]  doxycycline (VIBRAMYCIN) 100 MG capsule Take 1 capsule (100 mg total) by mouth 2 (two) times daily. 11/12/17   Montine Circle, PA-C  ergocalciferol (VITAMIN D2) 1.25 MG (50000 UT) capsule Take by mouth. 08/04/17   [provider]  ferrous sulfate 325 (65 FE) MG tablet Take 325 mg by mouth 2 (two) times daily with a meal. Reported on 10/28/2015 10/18/13   Delfina Redwood, MD  gabapentin (NEURONTIN) 300 MG capsule Take 300 mg by mouth 3 (three) times daily.    [provider]  gabapentin (NEURONTIN) 800 MG tablet Take by mouth. 03/31/21   [provider]  HYDROcodone-acetaminophen (NORCO/VICODIN) 5-325 MG tablet Take 1 tablet by mouth every 4 (four) hours as needed. 09/13/18   Isla Pence, MD  ibuprofen (ADVIL,MOTRIN) 600 MG tablet Take 1 tablet (600 mg total) by mouth every 6 (six) hours as needed. 09/13/18   Isla Pence, MD  lidocaine (LIDODERM) 5 % Place 1 patch onto the skin daily. Remove & Discard patch within 12 hours or as directed by MD 02/03/21   Henderly, Britni A, PA-C  Melatonin 5 MG CAPS Take by mouth.    [provider]  meloxicam (MOBIC) 15 MG tablet Take 1 tablet (15 mg total) by mouth daily. 11/08/16   Jeannett Senior, PA-C  methocarbamol (ROBAXIN) 500 MG tablet Take 1 tablet (500 mg total) by mouth 2 (two) times daily. 02/03/21   Henderly, Britni A, PA-C  Multiple Vitamin (MULTIVITAMIN) capsule Take 1 capsule by mouth daily.    [provider]  naproxen (NAPROSYN) 500 MG tablet Take 1 tablet (500 mg total) by mouth 2 (two) times daily with a meal. 03/17/16   Leo Grosser, MD  RYBELSUS 7 MG TABS Take 1 tablet by mouth daily. 11/11/20   [provider]  traMADol (ULTRAM) 50 MG tablet Take 1 tablet (50 mg total) by mouth every 6 (six) hours as needed. 06/12/17   Margarita Mail, PA-C      Allergies     Neomycin    Review of Systems   Review of Systems  Neurological:  Positive for weakness and headaches.    Physical Exam Updated Vital Signs BP 137/85   Pulse 86   Temp 99.2 F (37.3 C)   Resp 18   LMP 05/12/2022 Comment: 7 days  SpO2 99%  Physical Exam Vitals and nursing note reviewed.  Constitutional:      General: She is not in acute distress.    Appearance: She is well-developed. She is not diaphoretic.  HENT:     Head: Normocephalic and atraumatic.     Comments: Swollen turbinates, posterior nasal drip, exquisite tenderness to percussion of the left frontal sinus, tm normal bilaterally.   Eyes:     Pupils: Pupils are equal, round, and reactive to light.  Cardiovascular:     Rate and Rhythm: Normal rate and regular rhythm.     Heart sounds: No murmur heard.    No friction rub. No gallop.  Pulmonary:     Effort: Pulmonary effort is normal.     Breath sounds: No wheezing or rales.  Abdominal:     General: There is no distension.     Palpations: Abdomen is soft.     Tenderness: There is no abdominal tenderness.  Musculoskeletal:        General: No tenderness.     Cervical back: Normal range of motion and neck supple.  Skin:    General: Skin is warm and dry.  Neurological:     Mental Status: She is alert and oriented to person, place, and time.  Psychiatric:        Behavior: Behavior normal.     ED Results / Procedures / Treatments   Labs (all labs ordered are listed, but only abnormal results are displayed) Labs Reviewed  RESP PANEL BY RT-PCR (FLU A&B, COVID) [id number]    EKG None  Radiology No results found.  Procedures Procedures    Medications Ordered in ED Medications - No data to display  ED Course/ Medical Decision Making/ A&P                           Medical Decision Making Risk Prescription drug management.   54 yoF with a chief complaints of cough congestion fevers chills myalgias going on for about 4 days now.  She is  well-appearing nontoxic.  Clear lung sounds.  She does have sinus congestion and significant tenderness on percussion.  We will treat with antibiotics.  We will have her follow-up with her family doctor in the office.  10:12 AM:  I have discussed the diagnosis/risks/treatment options with the patient and family.  Evaluation and diagnostic testing in the emergency department does not suggest an emergent condition requiring admission or immediate  intervention beyond what has been performed at this time.  They will follow up with  PCP. We also discussed returning to the ED immediately if new or worsening sx occur. We discussed the sx which are most concerning (e.g., sudden worsening pain, fever, inability to tolerate by mouth) that necessitate immediate return. Medications administered to the patient during their visit and any new prescriptions provided to the patient are listed below.  Medications given during this visit Medications - No data to display   The patient appears reasonably screen and/or stabilized for discharge and I doubt any other medical condition or other [facility] requiring further screening, evaluation, or treatment in the ED at this time prior to discharge.          Final Clinical Impression(s) / ED Diagnoses Final diagnoses:  Acute URI    Rx / DC Orders ED Discharge Orders          Ordered    benzonatate (TESSALON) 100 MG capsule  Every 8 hours        06/05/22 1010    ondansetron (ZOFRAN-ODT) 4 MG disintegrating tablet        06/05/22 1010    amoxicillin-clavulanate (AUGMENTIN) 875-125 MG tablet  Every 12 hours        06/05/22 Lindstrom, Karesa Maultsby, DO 06/05/22 1012

## 2023-11-17 ENCOUNTER — Ambulatory Visit: Payer: Self-pay | Admitting: Family Medicine

## 2024-06-07 ENCOUNTER — Other Ambulatory Visit: Payer: Self-pay | Admitting: Nurse Practitioner

## 2024-06-07 DIAGNOSIS — Z1231 Encounter for screening mammogram for malignant neoplasm of breast: Secondary | ICD-10-CM

## 2024-06-14 ENCOUNTER — Ambulatory Visit
Admission: RE | Admit: 2024-06-14 | Discharge: 2024-06-14 | Disposition: A | Discharge status: Home / Self Care | Payer: Self-pay | Source: Ambulatory Visit | Attending: Nurse Practitioner | Admitting: Nurse Practitioner

## 2024-06-14 DIAGNOSIS — Z1231 Encounter for screening mammogram for malignant neoplasm of breast: Secondary | ICD-10-CM
